# Patient Record
Sex: Female | Born: 1990
Health system: Southern US, Community
[De-identification: ages and names within clinical notes are randomized; demographics above are authoritative.]

## PROBLEM LIST (undated history)

## (undated) DIAGNOSIS — B019 Varicella without complication: Secondary | ICD-10-CM

## (undated) DIAGNOSIS — Z8742 Personal history of other diseases of the female genital tract: Secondary | ICD-10-CM

## (undated) DIAGNOSIS — O26649 Intrahepatic cholestasis of pregnancy, unspecified trimester: Secondary | ICD-10-CM

## (undated) DIAGNOSIS — O26619 Liver and biliary tract disorders in pregnancy, unspecified trimester: Secondary | ICD-10-CM

## (undated) DIAGNOSIS — K831 Obstruction of bile duct: Secondary | ICD-10-CM

## (undated) DIAGNOSIS — G43909 Migraine, unspecified, not intractable, without status migrainosus: Secondary | ICD-10-CM

## (undated) DIAGNOSIS — M419 Scoliosis, unspecified: Secondary | ICD-10-CM

## (undated) HISTORY — DX: Scoliosis, unspecified: M41.9

## (undated) HISTORY — DX: Varicella without complication: B01.9

## (undated) HISTORY — DX: Intrahepatic cholestasis of pregnancy, unspecified trimester: O26.649

## (undated) HISTORY — DX: Liver and biliary tract disorders in pregnancy, unspecified trimester: O26.619

## (undated) HISTORY — DX: Migraine, unspecified, not intractable, without status migrainosus: G43.909

## (undated) HISTORY — DX: Obstruction of bile duct: K83.1

## (undated) HISTORY — DX: Personal history of other diseases of the female genital tract: Z87.42

## (undated) HISTORY — PX: TUBAL LIGATION: SHX77

## (undated) HISTORY — PX: WISDOM TOOTH EXTRACTION: SHX21

---

## 2009-05-20 HISTORY — PX: BREAST LUMPECTOMY: SHX2

## 2009-05-20 HISTORY — PX: BREAST EXCISIONAL BIOPSY: SUR124

## 2011-03-09 ENCOUNTER — Emergency Department (HOSPITAL_COMMUNITY)

## 2011-03-09 ENCOUNTER — Emergency Department (HOSPITAL_COMMUNITY)
Admission: EM | Admit: 2011-03-09 | Discharge: 2011-03-09 | Disposition: A | Discharge status: Home / Self Care | Attending: Emergency Medicine | Admitting: Emergency Medicine

## 2011-03-09 DIAGNOSIS — G43909 Migraine, unspecified, not intractable, without status migrainosus: Secondary | ICD-10-CM | POA: Insufficient documentation

## 2011-03-09 LAB — URINALYSIS, ROUTINE W REFLEX MICROSCOPIC
Bilirubin Urine: NEGATIVE
Hgb urine dipstick: NEGATIVE
Urobilinogen, UA: 0.2 mg/dL (ref 0.0–1.0)

## 2011-03-09 LAB — POCT I-STAT, CHEM 8
Calcium, Ion: 1.18 mmol/L (ref 1.12–1.32)
HCT: 47 % — ABNORMAL HIGH (ref 36.0–46.0)
TCO2: 22 mmol/L (ref 0–100)

## 2011-03-09 LAB — POCT PREGNANCY, URINE: Preg Test, Ur: NEGATIVE

## 2012-05-20 HISTORY — PX: BREAST BIOPSY: SHX20

## 2013-01-21 ENCOUNTER — Ambulatory Visit (INDEPENDENT_AMBULATORY_CARE_PROVIDER_SITE_OTHER): Payer: PRIVATE HEALTH INSURANCE | Admitting: Obstetrics & Gynecology

## 2013-01-21 ENCOUNTER — Encounter: Payer: Self-pay | Admitting: Obstetrics & Gynecology

## 2013-01-21 VITALS — BP 119/87 | HR 86 | Ht 61.0 in | Wt 105.0 lb

## 2013-01-21 DIAGNOSIS — Z124 Encounter for screening for malignant neoplasm of cervix: Secondary | ICD-10-CM

## 2013-01-21 DIAGNOSIS — D249 Benign neoplasm of unspecified breast: Secondary | ICD-10-CM

## 2013-01-21 DIAGNOSIS — Z01419 Encounter for gynecological examination (general) (routine) without abnormal findings: Secondary | ICD-10-CM

## 2013-01-21 DIAGNOSIS — D242 Benign neoplasm of left breast: Secondary | ICD-10-CM | POA: Insufficient documentation

## 2013-01-21 DIAGNOSIS — Z309 Encounter for contraceptive management, unspecified: Secondary | ICD-10-CM

## 2013-01-21 DIAGNOSIS — Z113 Encounter for screening for infections with a predominantly sexual mode of transmission: Secondary | ICD-10-CM

## 2013-01-21 MED ORDER — NORETHIN ACE-ETH ESTRAD-FE 1-20 MG-MCG(24) PO TABS: 1.0000 | ORAL_TABLET | Freq: Every day | ORAL | Status: DC

## 2013-01-21 NOTE — Progress Notes (Signed)
  Subjective:     Sally Oliver is a 22 y.o. G0 female and is here for a comprehensive physical exam. The patient reports no gynecologic problems.  She has a history of a large left breast fibroadenoma that was partially resected in 2011, and re-biopsied in 06/2012 with benign findings. She reports this is stable in size, no other symptoms. Patient desires refill of OCPs.   History   Social History  . Marital Status: Single    Spouse Name: N/A    Number of Children: N/A  . Years of Education: N/A   Occupational History  . Not on file.   Social History Main Topics  . Smoking status: Never Smoker   . Smokeless tobacco: Never Used  . Alcohol Use: Yes     Comment: social  . Drug Use: No  . Sexual Activity: Yes    Partners: Male    Birth Control/ Protection: None   Other Topics Concern  . Not on file   Social History Narrative  . No narrative on file   No health maintenance topics applied.  The following portions of the patient's history were reviewed and updated as appropriate: allergies, current medications, past family history, past medical history, past social history, past surgical history and problem list.  Has not received Gardasil series and is not interested in it at this point.  Review of Systems Pertinent items are noted in HPI.   Objective:   BP 119/87  Pulse 86  Ht 5\' 1"  (1.549 m)  Wt 105 lb (47.628 kg)  BMI 19.85 kg/m2  LMP 12/28/2012 GENERAL: Well-developed, well-nourished female in no acute distress.  HEENT: Normocephalic, atraumatic. Sclerae anicteric.  NECK: Supple. Normal thyroid.  LUNGS: Clear to auscultation bilaterally.  HEART: Regular rate and rhythm. BREASTS: Symmetric in size.  3 cm x 2 cm solid, mobile mass palpated in inner, upper quadrant (around 10 o'clock) of the left breast. No other masses, skin changes, nipple drainage, or lymphadenopathy. ABDOMEN: Soft, nontender, nondistended. No organomegaly. PELVIC: Normal external female  genitalia. Vagina is pink and rugated.  Normal discharge. Normal cervix contour. Pap smear obtained. Uterus is normal in size. No adnexal mass or tenderness.  EXTREMITIES: No cyanosis, clubbing, or edema, 2+ distal pulses.   Assessment:   Healthy female exam.   Left breast fibroadenoma  Plan:   Pap done, will follow up results and manage accordingly. Patient was followed by a surgeon in [place], she will let us know when she wants [place] [facility].   OCPs refilled Routine preventative health maintenance measures emphasized

## 2013-01-21 NOTE — Patient Instructions (Signed)
Preventive Care for Adults, Female A healthy lifestyle and preventive care can promote health and wellness. Preventive health guidelines for women include the following key practices.  A routine yearly physical is a good way to check with your caregiver about your health and preventive screening. It is a chance to share any concerns and updates on your health, and to receive a thorough exam.  Visit your dentist for a routine exam and preventive care every 6 months. Brush your teeth twice a day and floss once a day. Good oral hygiene prevents tooth decay and gum disease.  The frequency of eye exams is based on your age, health, family medical history, use of contact lenses, and other factors. Follow your caregiver's recommendations for frequency of eye exams.  Eat a healthy diet. Foods like vegetables, fruits, whole grains, low-fat dairy products, and lean protein foods contain the nutrients you need without too many calories. Decrease your intake of foods high in solid fats, added sugars, and salt. Eat the right amount of calories for you.Get information about a proper diet from your caregiver, if necessary.  Regular physical exercise is one of the most important things you can do for your health. Most adults should get at least 150 minutes of moderate-intensity exercise (any activity that increases your heart rate and causes you to sweat) each week. In addition, most adults need muscle-strengthening exercises on 2 or more days a week.  Maintain a healthy weight. The body mass index (BMI) is a screening tool to identify possible weight problems. It provides an estimate of body fat based on height and weight. Your caregiver can help determine your BMI, and can help you achieve or maintain a healthy weight.For adults 20 years and older:  A BMI below 18.5 is considered underweight.  A BMI of 18.5 to 24.9 is normal.  A BMI of 25 to 29.9 is considered overweight.  A BMI of 30 and above is  considered obese.  Maintain normal blood lipids and cholesterol levels by exercising and minimizing your intake of saturated fat. Eat a balanced diet with plenty of fruit and vegetables. Blood tests for lipids and cholesterol should begin at age 41 and be repeated every 5 years. If your lipid or cholesterol levels are high, you are over 50, or you are at high risk for heart disease, you may need your cholesterol levels checked more frequently.Ongoing high lipid and cholesterol levels should be treated with medicines if diet and exercise are not effective.  If you smoke, find out from your caregiver how to quit. If you do not use tobacco, do not start.  If you are pregnant, do not drink alcohol. If you are breastfeeding, be very cautious about drinking alcohol. If you are not pregnant and choose to drink alcohol, do not exceed 1 drink per day. One drink is considered to be 12 ounces (355 mL) of beer, 5 ounces (148 mL) of wine, or 1.5 ounces (44 mL) of liquor.  Avoid use of street drugs. Do not share needles with anyone. Ask for help if you need support or instructions about stopping the use of drugs.  High blood pressure causes heart disease and increases the risk of stroke. Your blood pressure should be checked at least every 1 to 2 years. Ongoing high blood pressure should be treated with medicines if weight loss and exercise are not effective.  If you are 65 to 24 years old, ask your caregiver if you should take aspirin to prevent strokes.  Diabetes  screening involves taking a blood sample to check your fasting blood sugar level. This should be done once every 3 years, after age 45, if you are within normal weight and without risk factors for diabetes. Testing should be considered at a younger age or be carried out more frequently if you are overweight and have at least 1 risk factor for diabetes.  Breast cancer screening is essential preventive care for women. You should practice "breast  self-awareness." This means understanding the normal appearance and feel of your breasts and may include breast self-examination. Any changes detected, no matter how small, should be reported to a caregiver. Women in their 20s and 30s should have a clinical breast exam (CBE) by a caregiver as part of a regular health exam every 1 to 3 years. After age 40, women should have a CBE every year. Starting at age 40, women should consider having a mammography (breast X-ray test) every year. Women who have a family history of breast cancer should talk to their caregiver about genetic screening. Women at a high risk of breast cancer should talk to their caregivers about having magnetic resonance imaging (MRI) and a mammography every year.  The Pap test is a screening test for cervical cancer. A Pap test can show cell changes on the cervix that might become cervical cancer if left untreated. A Pap test is a procedure in which cells are obtained and examined from the lower end of the uterus (cervix).  Women should have a Pap test starting at age 21.  Between ages 21 and 29, Pap tests should be repeated every 2 years.  Beginning at age 30, you should have a Pap test every 3 years as long as the past 3 Pap tests have been normal.  Some women have medical problems that increase the chance of getting cervical cancer. Talk to your caregiver about these problems. It is especially important to talk to your caregiver if a new problem develops soon after your last Pap test. In these cases, your caregiver may recommend more frequent screening and Pap tests.  The above recommendations are the same for women who have or have not gotten the vaccine for human papillomavirus (HPV).  If you had a hysterectomy for a problem that was not cancer or a condition that could lead to cancer, then you no longer need Pap tests. Even if you no longer need a Pap test, a regular exam is a good idea to make sure no other problems are  starting.  If you are between ages 65 and 70, and you have had normal Pap tests going back 10 years, you no longer need Pap tests. Even if you no longer need a Pap test, a regular exam is a good idea to make sure no other problems are starting.  If you have had past treatment for cervical cancer or a condition that could lead to cancer, you need Pap tests and screening for cancer for at least 20 years after your treatment.  If Pap tests have been discontinued, risk factors (such as a new sexual partner) need to be reassessed to determine if screening should be resumed.  The HPV test is an additional test that may be used for cervical cancer screening. The HPV test looks for the virus that can cause the cell changes on the cervix. The cells collected during the Pap test can be tested for HPV. The HPV test could be used to screen women aged 30 years and older, and should   be used in women of any age who have unclear Pap test results. After the age of 30, women should have HPV testing at the same frequency as a Pap test.  Colorectal cancer can be detected and often prevented. Most routine colorectal cancer screening begins at the age of 50 and continues through age 75. However, your caregiver may recommend screening at an earlier age if you have risk factors for colon cancer. On a yearly basis, your caregiver may provide home test kits to check for hidden blood in the stool. Use of a small camera at the end of a tube, to directly examine the colon (sigmoidoscopy or colonoscopy), can detect the earliest forms of colorectal cancer. Talk to your caregiver about this at age 50, when routine screening begins. Direct examination of the colon should be repeated every 5 to 10 years through age 75, unless early forms of pre-cancerous polyps or small growths are found.  Hepatitis C blood testing is recommended for all people born from 1945 through 1965 and any individual with known risks for hepatitis C.  Practice  safe sex. Use condoms and avoid high-risk sexual practices to reduce the spread of sexually transmitted infections (STIs). STIs include gonorrhea, chlamydia, syphilis, trichomonas, herpes, HPV, and human immunodeficiency virus (HIV). Herpes, HIV, and HPV are viral illnesses that have no cure. They can result in disability, cancer, and death. Sexually active women aged 25 and younger should be checked for chlamydia. Older women with new or multiple partners should also be tested for chlamydia. Testing for other STIs is recommended if you are sexually active and at increased risk.  Osteoporosis is a disease in which the bones lose minerals and strength with aging. This can result in serious bone fractures. The risk of osteoporosis can be identified using a bone density scan. Women ages 65 and over and women at risk for fractures or osteoporosis should discuss screening with their caregivers. Ask your caregiver whether you should take a calcium supplement or vitamin D to reduce the rate of osteoporosis.  Menopause can be associated with physical symptoms and risks. Hormone replacement therapy is available to decrease symptoms and risks. You should talk to your caregiver about whether hormone replacement therapy is right for you.  Use sunscreen with sun protection factor (SPF) of 30 or more. Apply sunscreen liberally and repeatedly throughout the day. You should seek shade when your shadow is shorter than you. Protect yourself by wearing long sleeves, pants, a wide-brimmed hat, and sunglasses year round, whenever you are outdoors.  Once a month, do a whole body skin exam, using a mirror to look at the skin on your back. Notify your caregiver of new moles, moles that have irregular borders, moles that are larger than a pencil eraser, or moles that have changed in shape or color.  Stay current with required immunizations.  Influenza. You need a dose every fall (or winter). The composition of the flu vaccine  changes each year, so being vaccinated once is not enough.  Pneumococcal polysaccharide. You need 1 to 2 doses if you smoke cigarettes or if you have certain chronic medical conditions. You need 1 dose at age 65 (or older) if you have never been vaccinated.  Tetanus, diphtheria, pertussis (Tdap, Td). Get 1 dose of Tdap vaccine if you are younger than age 65, are over 65 and have contact with an infant, are a healthcare worker, are pregnant, or simply want to be protected from whooping cough. After that, you need a Td   booster dose every 10 years. Consult your caregiver if you have not had at least 3 tetanus and diphtheria-containing shots sometime in your life or have a deep or dirty wound.  HPV. You need this vaccine if you are a woman age 26 or younger. The vaccine is given in 3 doses over 6 months.  Measles, mumps, rubella (MMR). You need at least 1 dose of MMR if you were born in 1957 or later. You may also need a second dose.  Meningococcal. If you are age 19 to 21 and a first-year college student living in a residence hall, or have one of several medical conditions, you need to get vaccinated against meningococcal disease. You may also need additional booster doses.  Zoster (shingles). If you are age 60 or older, you should get this vaccine.  Varicella (chickenpox). If you have never had chickenpox or you were vaccinated but received only 1 dose, talk to your caregiver to find out if you need this vaccine.  Hepatitis A. You need this vaccine if you have a specific risk factor for hepatitis A virus infection or you simply wish to be protected from this disease. The vaccine is usually given as 2 doses, 6 to 18 months apart.  Hepatitis B. You need this vaccine if you have a specific risk factor for hepatitis B virus infection or you simply wish to be protected from this disease. The vaccine is given in 3 doses, usually over 6 months. Preventive Services / Frequency Ages 19 to 39  Blood  pressure check.** / Every 1 to 2 years.  Lipid and cholesterol check.** / Every 5 years beginning at age 20.  Clinical breast exam.** / Every 3 years for women in their 20s and 30s.  Pap test.** / Every 2 years from ages 21 through 29. Every 3 years starting at age 30 through age 65 or 70 with a history of 3 consecutive normal Pap tests.  HPV screening.** / Every 3 years from ages 30 through ages 65 to 70 with a history of 3 consecutive normal Pap tests.  Hepatitis C blood test.** / For any individual with known risks for hepatitis C.  Skin self-exam. / Monthly.  Influenza immunization.** / Every year.  Pneumococcal polysaccharide immunization.** / 1 to 2 doses if you smoke cigarettes or if you have certain chronic medical conditions.  Tetanus, diphtheria, pertussis (Tdap, Td) immunization. / A one-time dose of Tdap vaccine. After that, you need a Td booster dose every 10 years.  HPV immunization. / 3 doses over 6 months, if you are 26 and younger.  Measles, mumps, rubella (MMR) immunization. / You need at least 1 dose of MMR if you were born in 1957 or later. You may also need a second dose.  Meningococcal immunization. / 1 dose if you are age 19 to 21 and a first-year college student living in a residence hall, or have one of several medical conditions, you need to get vaccinated against meningococcal disease. You may also need additional booster doses.  Varicella immunization.** / Consult your caregiver.  Hepatitis A immunization.** / Consult your caregiver. 2 doses, 6 to 18 months apart.  Hepatitis B immunization.** / Consult your caregiver. 3 doses usually over 6 months. Ages 40 to 64  Blood pressure check.** / Every 1 to 2 years.  Lipid and cholesterol check.** / Every 5 years beginning at age 20.  Clinical breast exam.** / Every year after age 40.  Mammogram.** / Every year beginning at age 40   and continuing for as long as you are in good health. Consult with your  caregiver.  Pap test.** / Every 3 years starting at age 30 through age 65 or 70 with a history of 3 consecutive normal Pap tests.  HPV screening.** / Every 3 years from ages 30 through ages 65 to 70 with a history of 3 consecutive normal Pap tests.  Fecal occult blood test (FOBT) of stool. / Every year beginning at age 50 and continuing until age 75. You may not need to do this test if you get a colonoscopy every 10 years.  Flexible sigmoidoscopy or colonoscopy.** / Every 5 years for a flexible sigmoidoscopy or every 10 years for a colonoscopy beginning at age 50 and continuing until age 75.  Hepatitis C blood test.** / For all people born from 1945 through 1965 and any individual with known risks for hepatitis C.  Skin self-exam. / Monthly.  Influenza immunization.** / Every year.  Pneumococcal polysaccharide immunization.** / 1 to 2 doses if you smoke cigarettes or if you have certain chronic medical conditions.  Tetanus, diphtheria, pertussis (Tdap, Td) immunization.** / A one-time dose of Tdap vaccine. After that, you need a Td booster dose every 10 years.  Measles, mumps, rubella (MMR) immunization. / You need at least 1 dose of MMR if you were born in 1957 or later. You may also need a second dose.  Varicella immunization.** / Consult your caregiver.  Meningococcal immunization.** / Consult your caregiver.  Hepatitis A immunization.** / Consult your caregiver. 2 doses, 6 to 18 months apart.  Hepatitis B immunization.** / Consult your caregiver. 3 doses, usually over 6 months. Ages 65 and over  Blood pressure check.** / Every 1 to 2 years.  Lipid and cholesterol check.** / Every 5 years beginning at age 20.  Clinical breast exam.** / Every year after age 40.  Mammogram.** / Every year beginning at age 40 and continuing for as long as you are in good health. Consult with your caregiver.  Pap test.** / Every 3 years starting at age 30 through age 65 or 70 with a 3  consecutive normal Pap tests. Testing can be stopped between 65 and 70 with 3 consecutive normal Pap tests and no abnormal Pap or HPV tests in the past 10 years.  HPV screening.** / Every 3 years from ages 30 through ages 65 or 70 with a history of 3 consecutive normal Pap tests. Testing can be stopped between 65 and 70 with 3 consecutive normal Pap tests and no abnormal Pap or HPV tests in the past 10 years.  Fecal occult blood test (FOBT) of stool. / Every year beginning at age 50 and continuing until age 75. You may not need to do this test if you get a colonoscopy every 10 years.  Flexible sigmoidoscopy or colonoscopy.** / Every 5 years for a flexible sigmoidoscopy or every 10 years for a colonoscopy beginning at age 50 and continuing until age 75.  Hepatitis C blood test.** / For all people born from 1945 through 1965 and any individual with known risks for hepatitis C.  Osteoporosis screening.** / A one-time screening for women ages 65 and over and women at risk for fractures or osteoporosis.  Skin self-exam. / Monthly.  Influenza immunization.** / Every year.  Pneumococcal polysaccharide immunization.** / 1 dose at age 65 (or older) if you have never been vaccinated.  Tetanus, diphtheria, pertussis (Tdap, Td) immunization. / A one-time dose of Tdap vaccine if you are over   65 and have contact with an infant, are a Research scientist (physical sciences), or simply want to be protected from whooping cough. After that, you need a Td booster dose every 10 years.  Varicella immunization.** / Consult your caregiver.  Meningococcal immunization.** / Consult your caregiver.  Hepatitis A immunization.** / Consult your caregiver. 2 doses, 6 to 18 months apart.  Hepatitis B immunization.** / Check with your caregiver. 3 doses, usually over 6 months. ** Family history and personal history of risk and conditions may change your caregiver's recommendations. Document Released: 07/02/2001 Document Revised: 07/29/2011  Document Reviewed: 10/01/2010 [facility] Patient Information 2014 [place], [place].  Thank you for enrolling in MyChart. Please follow the instructions below to securely access your online medical record. MyChart allows you to send messages to your doctor, view your test results, manage appointments, and more.   How Do I Sign Up? 1. In your Internet browser, go to [employer] and enter [email or web address]. 2. Click on the Sign Up Now link in the Sign In box. You will see the New Member Sign Up page. 3. Enter your MyChart Access Code exactly as it appears below. You will not need to use this code after you've completed the sign-up process. If you do not sign up before the expiration date, you must request a new code. MyChart Access Code: [email or web address] Expires: 02/20/2013 10:25 AM  4. Enter your Social Security Number ([id number]) and Date of Birth (mm/dd/yyyy) as indicated and click Submit. You will be taken to the next sign-up page. 5. Create a MyChart ID. This will be your MyChart login ID and cannot be changed, so think of one that is secure and easy to remember. 6. Create a MyChart password. You can change your password at any time. 7. Enter your Password Reset Question and Answer. This can be used at a later time if you forget your password.  8. Enter your e-mail address. You will receive e-mail notification when new information is available in MyChart. 9. Click Sign Up. You can now view your medical record.   Additional Information Remember, MyChart is NOT to be used for urgent needs. For medical emergencies, dial 911.

## 2013-06-15 ENCOUNTER — Encounter: Payer: Self-pay | Admitting: Internal Medicine

## 2013-06-15 ENCOUNTER — Ambulatory Visit (INDEPENDENT_AMBULATORY_CARE_PROVIDER_SITE_OTHER): Payer: PRIVATE HEALTH INSURANCE | Admitting: Internal Medicine

## 2013-06-15 VITALS — BP 104/62 | HR 81 | Temp 98.5°F | Ht 62.0 in | Wt 106.0 lb

## 2013-06-15 DIAGNOSIS — Z0000 Encounter for general adult medical examination without abnormal findings: Secondary | ICD-10-CM

## 2013-06-15 NOTE — Progress Notes (Signed)
HPI  Pt presents to the clinic today to establish care. She is transferring care from Dr. Ronnald Nian at [facility] in [place]. She has no concerns today.  Flu: never Tetanus: 2018 LMP: 06/05/2013 Pap Smear: 01/2013 Dentist: [facility]  Past Medical History  Diagnosis Date  . Chicken pox   . Migraine headache     Current Outpatient Prescriptions  Medication Sig Dispense Refill  . loratadine (CLARITIN) 10 MG tablet Take 10 mg by mouth daily.      . Norethindrone Acetate-Ethinyl Estrad-FE (LOESTRIN 24 FE) 1-20 MG-MCG(24) tablet Take 1 tablet by mouth daily.  1 Package  11   No current facility-administered medications for this visit.    No Known Allergies  Family History  Problem Relation Age of Onset  . Migraines Mother   . Heart disease Father   . Stroke Father   . Arthritis Father     History   Social History  . Marital Status: Single    Spouse Name: N/A    Number of Children: N/A  . Years of Education: N/A   Occupational History  . Not on file.   Social History Main Topics  . Smoking status: Never Smoker   . Smokeless tobacco: Never Used  . Alcohol Use: Yes     Comment: social-occasional  . Drug Use: No  . Sexual Activity: Yes    Partners: Male    Birth Control/ Protection: None   Other Topics Concern  . Not on file   Social History Narrative  . No narrative on file    ROS:  Constitutional: Denies fever, malaise, fatigue, headache or abrupt weight changes.  HEENT: Denies eye pain, eye redness, ear pain, ringing in the ears, wax buildup, runny nose, nasal congestion, bloody nose, or sore throat. Respiratory: Denies difficulty breathing, shortness of breath, cough or sputum production.   Cardiovascular: Denies chest pain, chest tightness, palpitations or swelling in the hands or feet.  Gastrointestinal: Denies abdominal pain, bloating, constipation, diarrhea or blood in the stool.  GU: Denies frequency, urgency, pain with  urination, blood in urine, odor or discharge. Musculoskeletal: Denies decrease in range of motion, difficulty with gait, muscle pain or joint pain and swelling.  Skin: Denies redness, rashes, lesions or ulcercations.  Neurological: Denies dizziness, difficulty with memory, difficulty with speech or problems with balance and coordination.   No other specific complaints in a complete review of systems (except as listed in HPI above).  PE:  BP 104/62  Pulse 81  Temp(Src) 98.5 F (36.9 C) (Oral)  Ht 5\' 2"  (1.575 m)  Wt 106 lb (48.081 kg)  BMI 19.38 kg/m2  SpO2 98%  LMP 06/05/2013 Wt Readings from Last 3 Encounters:  06/15/13 106 lb (48.081 kg)  01/21/13 105 lb (47.628 kg)    General: Appears her stated age, well developed, well nourished in NAD. HEENT: Head: normal shape and size; Eyes: sclera white, no icterus, conjunctiva pink, PERRLA and EOMs intact; Ears: Tm's gray and intact, normal light reflex; Nose: mucosa pink and moist, septum midline; Throat/Mouth: Teeth present, mucosa pink and moist, no lesions or ulcerations noted.  Neck: Normal range of motion. Neck supple, trachea midline. No massses, lumps or thyromegaly present.  Cardiovascular: Normal rate and rhythm. S1,S2 noted.  No murmur, rubs or gallops noted. No JVD or BLE edema. No carotid bruits noted. Pulmonary/Chest: Normal effort and positive vesicular breath sounds. No respiratory distress. No wheezes, rales or ronchi noted.  Abdomen: Soft and nontender. Normal bowel sounds, no  bruits noted. No distention or masses noted. Liver, spleen and kidneys non palpable. Musculoskeletal: Normal range of motion. No signs of joint swelling. No difficulty with gait.  Neurological: Alert and oriented. Cranial nerves II-XII intact. Coordination normal. +DTRs bilaterally. Psychiatric: Mood and affect normal. Behavior is normal. Judgment and thought content normal.     Assessment and Plan:  Preventative Health Maintenance:  Pt  declines flu shot today All other HM UTD Will defer lab work today  RTC in 1 year or sooner if needed

## 2013-06-15 NOTE — Progress Notes (Signed)
Pre-visit discussion using our clinic review tool. No additional management support is needed unless otherwise documented below in the visit note.  

## 2013-06-15 NOTE — Patient Instructions (Signed)

## 2013-06-15 NOTE — Progress Notes (Signed)
HPI: Pt presents today to establish care. She is transitioning care from [place] where she lived prior here. She denied any complaints and concerns. Exercise regularly and eats a balanced diet.   LMP: 06/05/13 Pap smear: 01/2013 Flu Shot: declines Tetanus: 2008 Eye Exam: 2013 Dentist: 2014 Performs monthly breast exams  Past Medical History  Diagnosis Date  . Chicken pox   . Migraine headache     Current Outpatient Prescriptions  Medication Sig Dispense Refill  . loratadine (CLARITIN) 10 MG tablet Take 10 mg by mouth daily.      . Norethindrone Acetate-Ethinyl Estrad-FE (LOESTRIN 24 FE) 1-20 MG-MCG(24) tablet Take 1 tablet by mouth daily.  1 Package  11   No current facility-administered medications for this visit.    No Known Allergies  Family History  Problem Relation Age of Onset  . Migraines Mother   . Heart disease Father   . Stroke Father   . Arthritis Father     History   Social History  . Marital Status: Single    Spouse Name: N/A    Number of Children: N/A  . Years of Education: N/A   Occupational History  . Not on file.   Social History Main Topics  . Smoking status: Never Smoker   . Smokeless tobacco: Never Used  . Alcohol Use: Yes     Comment: social-occasional  . Drug Use: No  . Sexual Activity: Yes    Partners: Male    Birth Control/ Protection: None   Other Topics Concern  . Not on file   Social History Narrative  . No narrative on file    ROS:  Constitutional: Denies fever, malaise, fatigue, headache or abrupt weight changes.  Respiratory: Denies difficulty breathing, shortness of breath, cough or sputum production.   Cardiovascular: Denies chest pain, chest tightness, palpitations or swelling in the hands or feet.  Neurological: Denies dizziness, difficulty with memory, difficulty with speech or problems with balance and coordination.   No other specific complaints in a complete review of systems (except as listed in HPI  above).  PE:  BP 104/62  Pulse 81  Temp(Src) 98.5 F (36.9 C) (Oral)  Ht 5\' 2"  (1.575 m)  Wt 106 lb (48.081 kg)  BMI 19.38 kg/m2  SpO2 98%  LMP 06/05/2013 Wt Readings from Last 3 Encounters:  06/15/13 106 lb (48.081 kg)  01/21/13 105 lb (47.628 kg)    General: Appears their stated age, well developed, well nourished in NAD. HEENT: Head: normal shape and size; Eyes: sclera white, no icterus, conjunctiva pink, PERRLA and EOMs intact; Ears: Tm's gray and intact, normal light reflex; Nose: mucosa pink and moist, septum midline; Throat/Mouth: Teeth present, mucosa pink and moist, no lesions or ulcerations noted.  Neck: Normal range of motion. Neck supple, trachea midline. No massses, lumps or thyromegaly present.  Cardiovascular: Normal rate and rhythm. S1,S2 noted.  No murmur, rubs or gallops noted. No JVD or BLE edema. No carotid bruits noted. Pulmonary/Chest: Normal effort and positive vesicular breath sounds. No respiratory distress. No wheezes, rales or ronchi noted.  Abdomen: Soft and nontender. Normal bowel sounds, no bruits noted. No distention or masses noted. Liver, spleen and kidneys non palpable. Musculoskeletal: Normal range of motion. No signs of joint swelling. No difficulty with gait.  Neurological: Alert and oriented. Cranial nerves II-XII intact. Coordination normal. +DTRs bilaterally. Psychiatric: Mood and affect normal. Behavior is normal. Judgment and thought content normal.      Assessment and Plan: Health Maintenance: Pt is  very healthy female; up to date on vaccine, health maintenance Continue diet and exercise No needs at this time Follow up for any concerns or annually  Lindzee Gouge S, Student-NP

## 2013-09-14 ENCOUNTER — Encounter: Payer: Self-pay | Admitting: Internal Medicine

## 2013-09-14 ENCOUNTER — Ambulatory Visit (INDEPENDENT_AMBULATORY_CARE_PROVIDER_SITE_OTHER): Payer: PRIVATE HEALTH INSURANCE | Admitting: Internal Medicine

## 2013-09-14 VITALS — BP 102/64 | HR 76 | Temp 98.2°F | Wt 106.5 lb

## 2013-09-14 DIAGNOSIS — J309 Allergic rhinitis, unspecified: Secondary | ICD-10-CM

## 2013-09-14 NOTE — Progress Notes (Signed)
Pre visit review using our clinic review tool, if applicable. No additional management support is needed unless otherwise documented below in the visit note. 

## 2013-09-14 NOTE — Patient Instructions (Addendum)
Allergic Rhinitis Allergic rhinitis is when the mucous membranes in the nose respond to allergens. Allergens are particles in the air that cause your body to have an allergic reaction. This causes you to release allergic antibodies. Through a chain of events, these eventually cause you to release histamine into the blood stream. Although meant to protect the body, it is this release of histamine that causes your discomfort, such as frequent sneezing, congestion, and an itchy, runny nose.  CAUSES  Seasonal allergic rhinitis (hay fever) is caused by pollen allergens that may come from grasses, trees, and weeds. Year-round allergic rhinitis (perennial allergic rhinitis) is caused by allergens such as house dust mites, pet dander, and mold spores.  SYMPTOMS   Nasal stuffiness (congestion).  Itchy, runny nose with sneezing and tearing of the eyes. DIAGNOSIS  Your health care provider can help you determine the allergen or allergens that trigger your symptoms. If you and your health care provider are unable to determine the allergen, skin or blood testing may be used. TREATMENT  Allergic Rhinitis does not have a cure, but it can be controlled by:  Medicines and allergy shots (immunotherapy).  Avoiding the allergen. Hay fever may often be treated with antihistamines in pill or nasal spray forms. Antihistamines block the effects of histamine. There are over-the-counter medicines that may help with nasal congestion and swelling around the eyes. Check with your health care provider before taking or giving this medicine.  If avoiding the allergen or the medicine prescribed do not work, there are many new medicines your health care provider can prescribe. Stronger medicine may be used if initial measures are ineffective. Desensitizing injections can be used if medicine and avoidance does not work. Desensitization is when a patient is given ongoing shots until the body becomes less sensitive to the allergen.  Make sure you follow up with your health care provider if problems continue. HOME CARE INSTRUCTIONS It is not possible to completely avoid allergens, but you can reduce your symptoms by taking steps to limit your exposure to them. It helps to know exactly what you are allergic to so that you can avoid your specific triggers. SEEK MEDICAL CARE IF:   You have a fever.  You develop a cough that does not stop easily (persistent).  You have shortness of breath.  You start wheezing.  Symptoms interfere with normal daily activities. Document Released: 01/29/2001 Document Revised: 02/24/2013 Document Reviewed: 01/11/2013 [employer] Patient Information 2014 [employer].  

## 2013-09-14 NOTE — Progress Notes (Addendum)
HPI  Pt presents to the clinic today with c/o nasal congestion, headache, facial pain and pressure and ear fullness. She reports this started 1 week ago. She is blowing thick green/mucous out of her nose. She has tried claritin and benadryl without much relief. She has no history of allergies or breathing problems. She has not had sick contacts that she is aware of.   Review of Systems    Past Medical History  Diagnosis Date  . Chicken pox   . Migraine headache     Family History  Problem Relation Age of Onset  . Migraines Mother   . Heart disease Father   . Stroke Father   . Arthritis Father     History   Social History  . Marital Status: Single    Spouse Name: N/A    Number of Children: N/A  . Years of Education: N/A   Occupational History  . Not on file.   Social History Main Topics  . Smoking status: Never Smoker   . Smokeless tobacco: Never Used  . Alcohol Use: Yes     Comment: social-occasional  . Drug Use: No  . Sexual Activity: Yes    Partners: Male    Birth Control/ Protection: None   Other Topics Concern  . Not on file   Social History Narrative  . No narrative on file    No Known Allergies   Constitutional: Positive headache, fatigue. Denies fever or abrupt weight changes.  HEENT:  Positive eye pain, pressure behind the eyes, facial pain, nasal congestion. Denies eye redness, ear pain, ringing in the ears, wax buildup, runny nose or bloody nose. Respiratory: Denies cough, difficulty breathing or shortness of breath.  Cardiovascular: Denies chest pain, chest tightness, palpitations or swelling in the hands or feet.   No other specific complaints in a complete review of systems (except as listed in HPI above).  Objective:  BP 102/64  Pulse 76  Temp(Src) 98.2 F (36.8 C) (Oral)  Wt 106 lb 8 oz (48.308 kg)  SpO2 98%   General: Appears her stated age, well developed, well nourished in NAD. HEENT: Head: normal shape and size, no sinus  tenderness noted; Eyes: sclera white, no icterus, conjunctiva pink, PERRLA and EOMs intact; Ears: Tm's gray and intact, normal light reflex; Nose: mucosa pink and moist, septum midline; Throat/Mouth: + PND. Teeth present, mucosa pink and moist, no exudate noted, no lesions or ulcerations noted.  Neck: Neck supple, trachea midline. No massses, lumps or thyromegaly present.  Cardiovascular: Normal rate and rhythm. S1,S2 noted.  No murmur, rubs or gallops noted. No JVD or BLE edema. No carotid bruits noted. Pulmonary/Chest: Normal effort and positive vesicular breath sounds. No respiratory distress. No wheezes, rales or ronchi noted.      Assessment & Plan:   Allergic Rhinitis:  Continue flonase  Try zyrtec OTC  RTC as needed or if symptoms persist.

## 2013-09-21 ENCOUNTER — Ambulatory Visit (INDEPENDENT_AMBULATORY_CARE_PROVIDER_SITE_OTHER): Payer: PRIVATE HEALTH INSURANCE | Admitting: Internal Medicine

## 2013-09-21 ENCOUNTER — Encounter: Payer: Self-pay | Admitting: Internal Medicine

## 2013-09-21 VITALS — BP 96/58 | HR 90 | Temp 98.2°F | Wt 106.2 lb

## 2013-09-21 DIAGNOSIS — J019 Acute sinusitis, unspecified: Secondary | ICD-10-CM

## 2013-09-21 MED ORDER — AMOXICILLIN-POT CLAVULANATE 875-125 MG PO TABS: 1.0000 | ORAL_TABLET | Freq: Two times a day (BID) | ORAL | Status: DC

## 2013-09-21 NOTE — Patient Instructions (Addendum)

## 2013-09-21 NOTE — Progress Notes (Signed)
HPI  Pt presents to the clinic today with c/o worsening headache, facial pain and pressure. This has now been going on for 2 weeks. She was seen for the same 09/14/13 and diagnosed with allergies. She was already taking flonase, and zyrtec was added. She reports that she has been taking these medications as prescribe. She is now having olive green mucous from her nose. She denies fever, but has had chills and body aches.  Review of Systems    Past Medical History  Diagnosis Date  . Chicken pox   . Migraine headache     Family History  Problem Relation Age of Onset  . Migraines Mother   . Heart disease Father   . Stroke Father   . Arthritis Father     History   Social History  . Marital Status: Single    Spouse Name: N/A    Number of Children: N/A  . Years of Education: N/A   Occupational History  . Not on file.   Social History Main Topics  . Smoking status: Never Smoker   . Smokeless tobacco: Never Used  . Alcohol Use: Yes     Comment: social-occasional  . Drug Use: No  . Sexual Activity: Yes    Partners: Male    Birth Control/ Protection: None   Other Topics Concern  . Not on file   Social History Narrative  . No narrative on file    No Known Allergies   Constitutional: Positive headache, fatigue and fever. Denies fever or abrupt weight changes.  HEENT:  Positive facial pain, nasal congestion and sore throat. Denies eye redness, ear pain, ringing in the ears, wax buildup, runny nose or bloody nose. Respiratory: Positive cough. Denies difficulty breathing or shortness of breath.  Cardiovascular: Denies chest pain, chest tightness, palpitations or swelling in the hands or feet.   No other specific complaints in a complete review of systems (except as listed in HPI above).  Objective:  BP 96/58  Pulse 90  Temp(Src) 98.2 F (36.8 C) (Oral)  Wt 106 lb 4 oz (48.195 kg)  SpO2 98%   General: Appears her stated age, well developed, well nourished in  NAD. HEENT: Head: normal shape and size, maxillary sinus tenderness noted; Eyes: sclera white, no icterus, conjunctiva pink, PERRLA and EOMs intact; Ears: Tm's gray and intact, normal light reflex, + effusions bilaterally; Nose: mucosa pink and moist, septum midline; Throat/Mouth: + PND. Teeth present, mucosa erythematous and moist, no exudate noted, no lesions or ulcerations noted.  Neck: Neck supple, trachea midline. No massses, lumps or thyromegaly present.  Cardiovascular: Normal rate and rhythm. S1,S2 noted.  No murmur, rubs or gallops noted. No JVD or BLE edema. No carotid bruits noted. Pulmonary/Chest: Normal effort and positive vesicular breath sounds. No respiratory distress. No wheezes, rales or ronchi noted.      Assessment & Plan:   Acute bacterial sinusitis  Can use a Neti Pot which can be purchased from your local drug store. Continue zyrtec and flonase Augmentin BID for 10 days  RTC as needed or if symptoms persist.

## 2013-09-21 NOTE — Progress Notes (Signed)
Pre visit review using our clinic review tool, if applicable. No additional management support is needed unless otherwise documented below in the visit note. 

## 2013-12-09 ENCOUNTER — Other Ambulatory Visit: Payer: Self-pay | Admitting: Obstetrics & Gynecology

## 2013-12-28 ENCOUNTER — Ambulatory Visit (INDEPENDENT_AMBULATORY_CARE_PROVIDER_SITE_OTHER): Payer: PRIVATE HEALTH INSURANCE | Admitting: Family Medicine

## 2013-12-28 ENCOUNTER — Encounter: Payer: Self-pay | Admitting: Family Medicine

## 2013-12-28 VITALS — BP 104/68 | HR 76 | Temp 98.0°F | Wt 103.5 lb

## 2013-12-28 DIAGNOSIS — H02846 Edema of left eye, unspecified eyelid: Secondary | ICD-10-CM

## 2013-12-28 DIAGNOSIS — H02849 Edema of unspecified eye, unspecified eyelid: Secondary | ICD-10-CM | POA: Insufficient documentation

## 2013-12-28 NOTE — Progress Notes (Signed)
Pre visit review using our clinic review tool, if applicable. No additional management support is needed unless otherwise documented below in the visit note. 

## 2013-12-28 NOTE — Assessment & Plan Note (Addendum)
Anticipate allergic cause - not consistent with viral or bacterial conjunctivitis. Treat with cool compresses to eye as well as OTC lubricating eye drops. continue antihistamine Red flags to return discussed Update if not improving with treatment. Would consider checking FLP at next CPE - ?early xanthoma.

## 2013-12-28 NOTE — Progress Notes (Signed)
   BP 104/68  Pulse 76  Temp(Src) 98 F (36.7 C) (Oral)  Wt 103 lb 8 oz (46.947 kg)   CC: conjunctivitis?  Subjective:    Patient ID: Sally Oliver, female    DOB: Nov 26, 1990, 23 y.o.   MRN: [id number]  HPI: Sally Oliver is a 23 y.o. female presenting on 12/28/2013 for Conjunctivitis   Woke up yesterday morning with itch on inside corner of eye and puffy eyelid.  No fevers/chills, congestion, rhinorrhea, ST, PNdrainage. No vision changes.  Sister got married 2 nights ago, was up all night so L eye was blood shot but now resolved. + h/o migraines. This year has had more allergy issues - controlled with claritin.  Relevant past medical, surgical, family and social history reviewed and updated as indicated.  Allergies and medications reviewed and updated. Current Outpatient Prescriptions on File Prior to Visit  Medication Sig  . LOMEDIA 24 FE 1-20 MG-MCG(24) tablet TAKE 1 TABLET BY MOUTH DAILY.  Marland Kitchen loratadine (CLARITIN) 10 MG tablet Take 10 mg by mouth daily.   No current facility-administered medications on file prior to visit.    Review of Systems Per HPI unless specifically indicated above    Objective:    BP 104/68  Pulse 76  Temp(Src) 98 F (36.7 C) (Oral)  Wt 103 lb 8 oz (46.947 kg)  Physical Exam  Nursing note and vitals reviewed. Constitutional: She appears well-developed and well-nourished. No distress.  HENT:  Head: Normocephalic and atraumatic.  Mouth/Throat: Uvula is midline, oropharynx is clear and moist and mucous membranes are normal. No oropharyngeal exudate, posterior oropharyngeal edema, posterior oropharyngeal erythema or tonsillar abscesses.  Some posterior oropharynx cobblestoning  Eyes: Conjunctivae and EOM are normal. Pupils are equal, round, and reactive to light. Right conjunctiva is not injected. Left conjunctiva is not injected. No scleral icterus.  No palpebral or conjunctival injection FROM with EOMI without pain Some swelling of left  upper eyelid especially at nasal portion 2 small yellow flesh colored papules on inside corner of lower eyelid per patient chronic  Neck: Normal range of motion. Neck supple. No thyromegaly present.  Lymphadenopathy:    She has no cervical adenopathy.       Assessment & Plan:   Problem List Items Addressed This Visit   Swelling of eyelid - Primary     Anticipate allergic cause - not consistent with viral or bacterial conjunctivitis. Treat with cool compresses to eye as well as OTC lubricating eye drops. continue antihistamine Red flags to return discussed Update if not improving with treatment. Would consider checking FLP at next CPE - ?early xanthoma.        Follow up plan: Return if symptoms worsen or fail to improve.

## 2013-12-28 NOTE — Patient Instructions (Signed)
I think you have allergic inflammation of inside corner of eyelid. Treat with cool compresses to left eye, avoid fingers in eye and use OTC lubricating eye drops like rephresh or hypotears. Update [place] if redness returning or pain, or not improving as expected with above.

## 2014-01-25 ENCOUNTER — Encounter: Payer: Self-pay | Admitting: Obstetrics & Gynecology

## 2014-01-25 ENCOUNTER — Ambulatory Visit (INDEPENDENT_AMBULATORY_CARE_PROVIDER_SITE_OTHER): Payer: PRIVATE HEALTH INSURANCE | Admitting: Obstetrics & Gynecology

## 2014-01-25 VITALS — BP 112/80 | HR 94 | Ht 61.0 in | Wt 106.0 lb

## 2014-01-25 DIAGNOSIS — Z124 Encounter for screening for malignant neoplasm of cervix: Secondary | ICD-10-CM

## 2014-01-25 DIAGNOSIS — Z01419 Encounter for gynecological examination (general) (routine) without abnormal findings: Secondary | ICD-10-CM

## 2014-01-25 DIAGNOSIS — Z113 Encounter for screening for infections with a predominantly sexual mode of transmission: Secondary | ICD-10-CM

## 2014-01-25 NOTE — Patient Instructions (Signed)
Preventive Care for Adults A healthy lifestyle and preventive care can promote health and wellness. Preventive health guidelines for women include the following key practices.  A routine yearly physical is a good way to check with your health care provider about your health and preventive screening. It is a chance to share any concerns and updates on your health and to receive a thorough exam.  Visit your dentist for a routine exam and preventive care every 6 months. Brush your teeth twice a day and floss once a day. Good oral hygiene prevents tooth decay and gum disease.  The frequency of eye exams is based on your age, health, family medical history, use of contact lenses, and other factors. Follow your health care provider's recommendations for frequency of eye exams.  Eat a healthy diet. Foods like vegetables, fruits, whole grains, low-fat dairy products, and lean protein foods contain the nutrients you need without too many calories. Decrease your intake of foods high in solid fats, added sugars, and salt. Eat the right amount of calories for you.Get information about a proper diet from your health care provider, if necessary.  Regular physical exercise is one of the most important things you can do for your health. Most adults should get at least 150 minutes of moderate-intensity exercise (any activity that increases your heart rate and causes you to sweat) each week. In addition, most adults need muscle-strengthening exercises on 2 or more days a week.  Maintain a healthy weight. The body mass index (BMI) is a screening tool to identify possible weight problems. It provides an estimate of body fat based on height and weight. Your health care provider can find your BMI and can help you achieve or maintain a healthy weight.For adults 20 years and older:  A BMI below 18.5 is considered underweight.  A BMI of 18.5 to 24.9 is normal.  A BMI of 25 to 29.9 is considered overweight.  A BMI of  30 and above is considered obese.  Maintain normal blood lipids and cholesterol levels by exercising and minimizing your intake of saturated fat. Eat a balanced diet with plenty of fruit and vegetables. Blood tests for lipids and cholesterol should begin at age 20 and be repeated every 5 years. If your lipid or cholesterol levels are high, you are over 50, or you are at high risk for heart disease, you may need your cholesterol levels checked more frequently.Ongoing high lipid and cholesterol levels should be treated with medicines if diet and exercise are not working.  If you smoke, find out from your health care provider how to quit. If you do not use tobacco, do not start.  Lung cancer screening is recommended for adults aged 55-80 years who are at high risk for developing lung cancer because of a history of smoking. A yearly low-dose CT scan of the lungs is recommended for people who have at least a 30-pack-year history of smoking and are a current smoker or have quit within the past 15 years. A pack year of smoking is smoking an average of 1 pack of cigarettes a day for 1 year (for example: 1 pack a day for 30 years or 2 packs a day for 15 years). Yearly screening should continue until the smoker has stopped smoking for at least 15 years. Yearly screening should be stopped for people who develop a health problem that would prevent them from having lung cancer treatment.  If you are pregnant, do not drink alcohol. If you are breastfeeding,   be very cautious about drinking alcohol. If you are not pregnant and choose to drink alcohol, do not have more than 1 drink per day. One drink is considered to be 12 ounces (355 mL) of beer, 5 ounces (148 mL) of wine, or 1.5 ounces (44 mL) of liquor.  Avoid use of street drugs. Do not share needles with anyone. Ask for help if you need support or instructions about stopping the use of drugs.  High blood pressure causes heart disease and increases the risk of  stroke. Your blood pressure should be checked at least every 1 to 2 years. Ongoing high blood pressure should be treated with medicines if weight loss and exercise do not work.  If you are 3-86 years old, ask your health care provider if you should take aspirin to prevent strokes.  Diabetes screening involves taking a blood sample to check your fasting blood sugar level. This should be done once every 3 years, after age 67, if you are within normal weight and without risk factors for diabetes. Testing should be considered at a younger age or be carried out more frequently if you are overweight and have at least 1 risk factor for diabetes.  Breast cancer screening is essential preventive care for women. You should practice "breast self-awareness." This means understanding the normal appearance and feel of your breasts and may include breast self-examination. Any changes detected, no matter how small, should be reported to a health care provider. Women in their 8s and 30s should have a clinical breast exam (CBE) by a health care provider as part of a regular health exam every 1 to 3 years. After age 70, women should have a CBE every year. Starting at age 25, women should consider having a mammogram (breast X-ray test) every year. Women who have a family history of breast cancer should talk to their health care provider about genetic screening. Women at a high risk of breast cancer should talk to their health care providers about having an MRI and a mammogram every year.  Breast cancer gene (BRCA)-related cancer risk assessment is recommended for women who have family members with BRCA-related cancers. BRCA-related cancers include breast, ovarian, tubal, and peritoneal cancers. Having family members with these cancers may be associated with an increased risk for harmful changes (mutations) in the breast cancer genes BRCA1 and BRCA2. Results of the assessment will determine the need for genetic counseling and  BRCA1 and BRCA2 testing.  Routine pelvic exams to screen for cancer are no longer recommended for nonpregnant women who are considered low risk for cancer of the pelvic organs (ovaries, uterus, and vagina) and who do not have symptoms. Ask your health care provider if a screening pelvic exam is right for you.  If you have had past treatment for cervical cancer or a condition that could lead to cancer, you need Pap tests and screening for cancer for at least 20 years after your treatment. If Pap tests have been discontinued, your risk factors (such as having a new sexual partner) need to be reassessed to determine if screening should be resumed. Some women have medical problems that increase the chance of getting cervical cancer. In these cases, your health care provider may recommend more frequent screening and Pap tests.  The HPV test is an additional test that may be used for cervical cancer screening. The HPV test looks for the virus that can cause the cell changes on the cervix. The cells collected during the Pap test can be  tested for HPV. The HPV test could be used to screen women aged 30 years and older, and should be used in women of any age who have unclear Pap test results. After the age of 30, women should have HPV testing at the same frequency as a Pap test.  Colorectal cancer can be detected and often prevented. Most routine colorectal cancer screening begins at the age of 50 years and continues through age 75 years. However, your health care provider may recommend screening at an earlier age if you have risk factors for colon cancer. On a yearly basis, your health care provider may provide home test kits to check for hidden blood in the stool. Use of a small camera at the end of a tube, to directly examine the colon (sigmoidoscopy or colonoscopy), can detect the earliest forms of colorectal cancer. Talk to your health care provider about this at age 50, when routine screening begins. Direct  exam of the colon should be repeated every 5-10 years through age 75 years, unless early forms of pre-cancerous polyps or small growths are found.  People who are at an increased risk for hepatitis B should be screened for this virus. You are considered at high risk for hepatitis B if:  You were born in a country where hepatitis B occurs often. Talk with your health care provider about which countries are considered high risk.  Your parents were born in a high-risk country and you have not received a shot to protect against hepatitis B (hepatitis B vaccine).  You have HIV or AIDS.  You use needles to inject street drugs.  You live with, or have sex with, someone who has hepatitis B.  You get hemodialysis treatment.  You take certain medicines for conditions like cancer, organ transplantation, and autoimmune conditions.  Hepatitis C blood testing is recommended for all people born from 1945 through 1965 and any individual with known risks for hepatitis C.  Practice safe sex. Use condoms and avoid high-risk sexual practices to reduce the spread of sexually transmitted infections (STIs). STIs include gonorrhea, chlamydia, syphilis, trichomonas, herpes, HPV, and human immunodeficiency virus (HIV). Herpes, HIV, and HPV are viral illnesses that have no cure. They can result in disability, cancer, and death.  You should be screened for sexually transmitted illnesses (STIs) including gonorrhea and chlamydia if:  You are sexually active and are younger than 24 years.  You are older than 24 years and your health care provider tells you that you are at risk for this type of infection.  Your sexual activity has changed since you were last screened and you are at an increased risk for chlamydia or gonorrhea. Ask your health care provider if you are at risk.  If you are at risk of being infected with HIV, it is recommended that you take a prescription medicine daily to prevent HIV infection. This is  called preexposure prophylaxis (PrEP). You are considered at risk if:  You are a heterosexual woman, are sexually active, and are at increased risk for HIV infection.  You take drugs by injection.  You are sexually active with a partner who has HIV.  Talk with your health care provider about whether you are at high risk of being infected with HIV. If you choose to begin PrEP, you should first be tested for HIV. You should then be tested every 3 months for as long as you are taking PrEP.  Osteoporosis is a disease in which the bones lose minerals and strength   with aging. This can result in serious bone fractures or breaks. The risk of osteoporosis can be identified using a bone density scan. Women ages 65 years and over and women at risk for fractures or osteoporosis should discuss screening with their health care providers. Ask your health care provider whether you should take a calcium supplement or vitamin D to reduce the rate of osteoporosis.  Menopause can be associated with physical symptoms and risks. Hormone replacement therapy is available to decrease symptoms and risks. You should talk to your health care provider about whether hormone replacement therapy is right for you.  Use sunscreen. Apply sunscreen liberally and repeatedly throughout the day. You should seek shade when your shadow is shorter than you. Protect yourself by wearing long sleeves, pants, a wide-brimmed hat, and sunglasses year round, whenever you are outdoors.  Once a month, do a whole body skin exam, using a mirror to look at the skin on your back. Tell your health care provider of new moles, moles that have irregular borders, moles that are larger than a pencil eraser, or moles that have changed in shape or color.  Stay current with required vaccines (immunizations).  Influenza vaccine. All adults should be immunized every year.  Tetanus, diphtheria, and acellular pertussis (Td, Tdap) vaccine. Pregnant women should  receive 1 dose of Tdap vaccine during each pregnancy. The dose should be obtained regardless of the length of time since the last dose. Immunization is preferred during the 27th-36th week of gestation. An adult who has not previously received Tdap or who does not know her vaccine status should receive 1 dose of Tdap. This initial dose should be followed by tetanus and diphtheria toxoids (Td) booster doses every 10 years. Adults with an unknown or incomplete history of completing a 3-dose immunization series with Td-containing vaccines should begin or complete a primary immunization series including a Tdap dose. Adults should receive a Td booster every 10 years.  Varicella vaccine. An adult without evidence of immunity to varicella should receive 2 doses or a second dose if she has previously received 1 dose. Pregnant females who do not have evidence of immunity should receive the first dose after pregnancy. This first dose should be obtained before leaving the health care facility. The second dose should be obtained 4-8 weeks after the first dose.  Human papillomavirus (HPV) vaccine. Females aged 13-26 years who have not received the vaccine previously should obtain the 3-dose series. The vaccine is not recommended for use in pregnant females. However, pregnancy testing is not needed before receiving a dose. If a female is found to be pregnant after receiving a dose, no treatment is needed. In that case, the remaining doses should be delayed until after the pregnancy. Immunization is recommended for any person with an immunocompromised condition through the age of 26 years if she did not get any or all doses earlier. During the 3-dose series, the second dose should be obtained 4-8 weeks after the first dose. The third dose should be obtained 24 weeks after the first dose and 16 weeks after the second dose.  Zoster vaccine. One dose is recommended for adults aged 60 years or older unless certain conditions are  present.  Measles, mumps, and rubella (MMR) vaccine. Adults born before 1957 generally are considered immune to measles and mumps. Adults born in 1957 or later should have 1 or more doses of MMR vaccine unless there is a contraindication to the vaccine or there is laboratory evidence of immunity to   each of the three diseases. A routine second dose of MMR vaccine should be obtained at least 28 days after the first dose for students attending postsecondary schools, health care workers, or international travelers. People who received inactivated measles vaccine or an unknown type of measles vaccine during 1963-1967 should receive 2 doses of MMR vaccine. People who received inactivated mumps vaccine or an unknown type of mumps vaccine before 1979 and are at high risk for mumps infection should consider immunization with 2 doses of MMR vaccine. For females of childbearing age, rubella immunity should be determined. If there is no evidence of immunity, females who are not pregnant should be vaccinated. If there is no evidence of immunity, females who are pregnant should delay immunization until after pregnancy. Unvaccinated health care workers born before 1957 who lack laboratory evidence of measles, mumps, or rubella immunity or laboratory confirmation of disease should consider measles and mumps immunization with 2 doses of MMR vaccine or rubella immunization with 1 dose of MMR vaccine.  Pneumococcal 13-valent conjugate (PCV13) vaccine. When indicated, a person who is uncertain of her immunization history and has no record of immunization should receive the PCV13 vaccine. An adult aged 19 years or older who has certain medical conditions and has not been previously immunized should receive 1 dose of PCV13 vaccine. This PCV13 should be followed with a dose of pneumococcal polysaccharide (PPSV23) vaccine. The PPSV23 vaccine dose should be obtained at least 8 weeks after the dose of PCV13 vaccine. An adult aged 19  years or older who has certain medical conditions and previously received 1 or more doses of PPSV23 vaccine should receive 1 dose of PCV13. The PCV13 vaccine dose should be obtained 1 or more years after the last PPSV23 vaccine dose.  Pneumococcal polysaccharide (PPSV23) vaccine. When PCV13 is also indicated, PCV13 should be obtained first. All adults aged 65 years and older should be immunized. An adult younger than age 65 years who has certain medical conditions should be immunized. Any person who resides in a nursing home or long-term care facility should be immunized. An adult smoker should be immunized. People with an immunocompromised condition and certain other conditions should receive both PCV13 and PPSV23 vaccines. People with human immunodeficiency virus (HIV) infection should be immunized as soon as possible after diagnosis. Immunization during chemotherapy or radiation therapy should be avoided. Routine use of PPSV23 vaccine is not recommended for American Indians, [place] Natives, or people younger than 65 years unless there are medical conditions that require PPSV23 vaccine. When indicated, people who have unknown immunization and have no record of immunization should receive PPSV23 vaccine. One-time revaccination 5 years after the first dose of PPSV23 is recommended for people aged 19-64 years who have chronic kidney failure, nephrotic syndrome, asplenia, or immunocompromised conditions. People who received 1-2 doses of PPSV23 before age 65 years should receive another dose of PPSV23 vaccine at age 65 years or later if at least 5 years have passed since the previous dose. Doses of PPSV23 are not needed for people immunized with PPSV23 at or after age 65 years.  Meningococcal vaccine. Adults with asplenia or persistent complement component deficiencies should receive 2 doses of quadrivalent meningococcal conjugate (MenACWY-D) vaccine. The doses should be obtained at least 2 months apart.  Microbiologists working with certain meningococcal bacteria, military recruits, people at risk during an outbreak, and people who travel to or live in countries with a high rate of meningitis should be immunized. A first-year college student up through age   21 years who is living in a residence hall should receive a dose if she did not receive a dose on or after her 16th birthday. Adults who have certain high-risk conditions should receive one or more doses of vaccine.  Hepatitis A vaccine. Adults who wish to be protected from this disease, have certain high-risk conditions, work with hepatitis A-infected animals, work in hepatitis A research labs, or travel to or work in countries with a high rate of hepatitis A should be immunized. Adults who were previously unvaccinated and who anticipate close contact with an international adoptee during the first 60 days after arrival in the [place] from a country with a high rate of hepatitis A should be immunized.  Hepatitis B vaccine. Adults who wish to be protected from this disease, have certain high-risk conditions, may be exposed to blood or other infectious body fluids, are household contacts or sex partners of hepatitis B positive people, are clients or workers in certain care facilities, or travel to or work in countries with a high rate of hepatitis B should be immunized.  Haemophilus influenzae type b (Hib) vaccine. A previously unvaccinated person with asplenia or sickle cell disease or having a scheduled splenectomy should receive 1 dose of Hib vaccine. Regardless of previous immunization, a recipient of a hematopoietic stem cell transplant should receive a 3-dose series 6-12 months after her successful transplant. Hib vaccine is not recommended for adults with HIV infection. Preventive Services / Frequency Ages 64 to 68 years  Blood pressure check.** / Every 1 to 2 years.  Lipid and cholesterol check.** / Every 5 years beginning at age  22.  Clinical breast exam.** / Every 3 years for women in their 88s and 53s.  BRCA-related cancer risk assessment.** / For women who have family members with a BRCA-related cancer (breast, ovarian, tubal, or peritoneal cancers).  Pap test.** / Every 2 years from ages 90 through 51. Every 3 years starting at age 21 through age 56 or 3 with a history of 3 consecutive normal Pap tests.  HPV screening.** / Every 3 years from ages 24 through ages 1 to 46 with a history of 3 consecutive normal Pap tests.  Hepatitis C blood test.** / For any individual with known risks for hepatitis C.  Skin self-exam. / Monthly.  Influenza vaccine. / Every year.  Tetanus, diphtheria, and acellular pertussis (Tdap, Td) vaccine.** / Consult your health care provider. Pregnant women should receive 1 dose of Tdap vaccine during each pregnancy. 1 dose of Td every 10 years.  Varicella vaccine.** / Consult your health care provider. Pregnant females who do not have evidence of immunity should receive the first dose after pregnancy.  HPV vaccine. / 3 doses over 6 months, if 72 and younger. The vaccine is not recommended for use in pregnant females. However, pregnancy testing is not needed before receiving a dose.  Measles, mumps, rubella (MMR) vaccine.** / You need at least 1 dose of MMR if you were born in 1957 or later. You may also need a 2nd dose. For females of childbearing age, rubella immunity should be determined. If there is no evidence of immunity, females who are not pregnant should be vaccinated. If there is no evidence of immunity, females who are pregnant should delay immunization until after pregnancy.  Pneumococcal 13-valent conjugate (PCV13) vaccine.** / Consult your health care provider.  Pneumococcal polysaccharide (PPSV23) vaccine.** / 1 to 2 doses if you smoke cigarettes or if you have certain conditions.  Meningococcal vaccine.** /  1 dose if you are age 19 to 21 years and a first-year college  student living in a residence hall, or have one of several medical conditions, you need to get vaccinated against meningococcal disease. You may also need additional booster doses.  Hepatitis A vaccine.** / Consult your health care provider.  Hepatitis B vaccine.** / Consult your health care provider.  Haemophilus influenzae type b (Hib) vaccine.** / Consult your health care provider. Ages 40 to 64 years  Blood pressure check.** / Every 1 to 2 years.  Lipid and cholesterol check.** / Every 5 years beginning at age 20 years.  Lung cancer screening. / Every year if you are aged 55-80 years and have a 30-pack-year history of smoking and currently smoke or have quit within the past 15 years. Yearly screening is stopped once you have quit smoking for at least 15 years or develop a health problem that would prevent you from having lung cancer treatment.  Clinical breast exam.** / Every year after age 40 years.  BRCA-related cancer risk assessment.** / For women who have family members with a BRCA-related cancer (breast, ovarian, tubal, or peritoneal cancers).  Mammogram.** / Every year beginning at age 40 years and continuing for as long as you are in good health. Consult with your health care provider.  Pap test.** / Every 3 years starting at age 30 years through age 65 or 70 years with a history of 3 consecutive normal Pap tests.  HPV screening.** / Every 3 years from ages 30 years through ages 65 to 70 years with a history of 3 consecutive normal Pap tests.  Fecal occult blood test (FOBT) of stool. / Every year beginning at age 50 years and continuing until age 75 years. You may not need to do this test if you get a colonoscopy every 10 years.  Flexible sigmoidoscopy or colonoscopy.** / Every 5 years for a flexible sigmoidoscopy or every 10 years for a colonoscopy beginning at age 50 years and continuing until age 75 years.  Hepatitis C blood test.** / For all people born from 1945 through  1965 and any individual with known risks for hepatitis C.  Skin self-exam. / Monthly.  Influenza vaccine. / Every year.  Tetanus, diphtheria, and acellular pertussis (Tdap/Td) vaccine.** / Consult your health care provider. Pregnant women should receive 1 dose of Tdap vaccine during each pregnancy. 1 dose of Td every 10 years.  Varicella vaccine.** / Consult your health care provider. Pregnant females who do not have evidence of immunity should receive the first dose after pregnancy.  Zoster vaccine.** / 1 dose for adults aged 60 years or older.  Measles, mumps, rubella (MMR) vaccine.** / You need at least 1 dose of MMR if you were born in 1957 or later. You may also need a 2nd dose. For females of childbearing age, rubella immunity should be determined. If there is no evidence of immunity, females who are not pregnant should be vaccinated. If there is no evidence of immunity, females who are pregnant should delay immunization until after pregnancy.  Pneumococcal 13-valent conjugate (PCV13) vaccine.** / Consult your health care provider.  Pneumococcal polysaccharide (PPSV23) vaccine.** / 1 to 2 doses if you smoke cigarettes or if you have certain conditions.  Meningococcal vaccine.** / Consult your health care provider.  Hepatitis A vaccine.** / Consult your health care provider.  Hepatitis B vaccine.** / Consult your health care provider.  Haemophilus influenzae type b (Hib) vaccine.** / Consult your health care provider. Ages 65   years and over  Blood pressure check.** / Every 1 to 2 years.  Lipid and cholesterol check.** / Every 5 years beginning at age 48 years.  Lung cancer screening. / Every year if you are aged 68-80 years and have a 30-pack-year history of smoking and currently smoke or have quit within the past 15 years. Yearly screening is stopped once you have quit smoking for at least 15 years or develop a health problem that would prevent you from having lung cancer  treatment.  Clinical breast exam.** / Every year after age 4 years.  BRCA-related cancer risk assessment.** / For women who have family members with a BRCA-related cancer (breast, ovarian, tubal, or peritoneal cancers).  Mammogram.** / Every year beginning at age 69 years and continuing for as long as you are in good health. Consult with your health care provider.  Pap test.** / Every 3 years starting at age 33 years through age 32 or 70 years with 3 consecutive normal Pap tests. Testing can be stopped between 65 and 70 years with 3 consecutive normal Pap tests and no abnormal Pap or HPV tests in the past 10 years.  HPV screening.** / Every 3 years from ages 32 years through ages 1 or 33 years with a history of 3 consecutive normal Pap tests. Testing can be stopped between 65 and 70 years with 3 consecutive normal Pap tests and no abnormal Pap or HPV tests in the past 10 years.  Fecal occult blood test (FOBT) of stool. / Every year beginning at age 77 years and continuing until age 27 years. You may not need to do this test if you get a colonoscopy every 10 years.  Flexible sigmoidoscopy or colonoscopy.** / Every 5 years for a flexible sigmoidoscopy or every 10 years for a colonoscopy beginning at age 61 years and continuing until age 68 years.  Hepatitis C blood test.** / For all people born from [birth year] through 1967 and any individual with known risks for hepatitis C.  Osteoporosis screening.** / A one-time screening for women ages 73 years and over and women at risk for fractures or osteoporosis.  Skin self-exam. / Monthly.  Influenza vaccine. / Every year.  Tetanus, diphtheria, and acellular pertussis (Tdap/Td) vaccine.** / 1 dose of Td every 10 years.  Varicella vaccine.** / Consult your health care provider.  Zoster vaccine.** / 1 dose for adults aged 67 years or older.  Pneumococcal 13-valent conjugate (PCV13) vaccine.** / Consult your health care provider.  Pneumococcal  polysaccharide (PPSV23) vaccine.** / 1 dose for all adults aged 43 years and older.  Meningococcal vaccine.** / Consult your health care provider.  Hepatitis A vaccine.** / Consult your health care provider.  Hepatitis B vaccine.** / Consult your health care provider.  Haemophilus influenzae type b (Hib) vaccine.** / Consult your health care provider. ** Family history and personal history of risk and conditions may change your health care provider's recommendations. Document Released: 07/02/2001 Document Revised: 09/20/2013 Document Reviewed: 10/01/2010 [facility] Patient Information 2015 [place], [place]. This information is not intended to replace advice given to you by your health care provider. Make sure you discuss any questions you have with your health care provider.  Thank you for enrolling in [facility]. Please follow the instructions below to securely access your online medical record. MyChart allows you to send messages to your doctor, view your test results, manage appointments, and more.   How Do I Sign Up? 1. In your [employer], go to [employer] and  enter [email or web address]. 2. Click on the Sign Up Now link in the Sign In box. You will see the New Member Sign Up page. 3. Enter your MyChart Access Code exactly as it appears below. You will not need to use this code after you've completed the sign-up process. If you do not sign up before the expiration date, you must request a new code.  MyChart Access Code: [email or web address] Expires: 02/26/2014  8:37 AM  4. Enter your Social Security Number ([email or web address]) and Date of Birth (mm/dd/yyyy) as indicated and click Submit. You will be taken to the next sign-up page. 5. Create a MyChart ID. This will be your MyChart login ID and cannot be changed, so think of one that is secure and easy to remember. 6. Create a MyChart password. You can change your password at any time. 7. Enter your Password Reset Question and Answer.  This can be used at a later time if you forget your password.  8. Enter your e-mail address. You will receive e-mail notification when new information is available in [facility]. 9. Click Sign Up. You can now view your medical record.   Additional Information Remember, MyChart is NOT to be used for urgent needs. For medical emergencies, dial 911.

## 2014-01-25 NOTE — Progress Notes (Signed)
    GYNECOLOGY CLINIC ANNUAL PREVENTATIVE CARE ENCOUNTER NOTE  Subjective:     Sally Oliver is a 23 y.o. G11 female here for a routine annual gynecologic exam.  The patient reports no gynecologic problems. She has a history of a left breast fibroadenoma that was partially resected in 2011, and re-biopsied in 06/2012 with benign findings. She reports this is stable in size, no other symptoms.    Gynecologic History No LMP recorded. Patient is not currently having periods (Reason: Oral contraceptives). Last Pap: 01/21/13. Results were: Normal pap, negative GC and Chlam.   The following portions of the patient's history were reviewed and updated as appropriate: allergies, current medications, past family history, past medical history, past social history, past surgical history and problem list. Has not received Gardasil series and is not interested in it at this point.  Review of Systems A comprehensive review of systems was negative.    Objective:   BP 112/80  Pulse 94  Ht 5\' 1"  (1.549 m)  Wt 106 lb (48.081 kg)  BMI 20.04 kg/m2 GENERAL: Well-developed, well-nourished female in no acute distress.  HEENT: Normocephalic, atraumatic. Sclerae anicteric.  NECK: Supple. Normal thyroid.  LUNGS: Clear to auscultation bilaterally.  HEART: Regular rate and rhythm. BREASTS: Symmetric in size. 3 cm x 2 cm solid, mobile mass palpated in inner, upper quadrant (around 10 o'clock) of the left breast. No other masses, skin changes, nipple drainage, or lymphadenopathy. ABDOMEN: Soft, nontender, nondistended. No organomegaly. PELVIC: Normal external female genitalia. Vagina is pink and rugated.  Normal discharge. Normal cervix contour. Pap smear obtained. Uterus is normal in size and retroverted. No adnexal mass or tenderness.  EXTREMITIES: No cyanosis, clubbing, or edema, 2+ distal pulses.   Assessment:   Annual gynecologic examination   Plan:   Pap done, will follow up results and manage  accordingly. Routine preventative health maintenance measures emphasized   Verita Schneiders, MD, [place] Attending [facility]

## 2014-01-27 LAB — CYTOLOGY - PAP

## 2014-03-17 ENCOUNTER — Encounter: Payer: Self-pay | Admitting: Internal Medicine

## 2014-03-17 ENCOUNTER — Ambulatory Visit (INDEPENDENT_AMBULATORY_CARE_PROVIDER_SITE_OTHER): Payer: PRIVATE HEALTH INSURANCE | Admitting: Internal Medicine

## 2014-03-17 VITALS — BP 98/56 | HR 78 | Temp 98.2°F | Wt 105.0 lb

## 2014-03-17 DIAGNOSIS — R490 Dysphonia: Secondary | ICD-10-CM

## 2014-03-17 NOTE — Progress Notes (Signed)
Pre visit review using our clinic review tool, if applicable. No additional management support is needed unless otherwise documented below in the visit note. 

## 2014-03-17 NOTE — Patient Instructions (Addendum)
Laryngitis °At the top of your windpipe is your voice box. It is the source of your voice. Inside your voice box are 2 bands of muscles called vocal cords. When you breathe, your vocal cords are relaxed and open so that air can get into the lungs. When you decide to say something, these cords come together and vibrate. The sound from these vibrations goes into your throat and comes out through your mouth as sound.  °Laryngitis is an inflammation of the vocal cords that causes hoarseness, cough, loss of voice, sore throat, and dry throat. Laryngitis can be temporary (acute) or long-term (chronic). Most cases of acute laryngitis improve with time.Chronic laryngitis lasts for more than 3 weeks. °CAUSES °Laryngitis can often be related to excessive smoking, talking, or yelling, as well as inhalation of toxic fumes and allergies. Acute laryngitis is usually caused by a viral infection, vocal strain, measles or mumps, or bacterial infections. Chronic laryngitis is usually caused by vocal cord strain, vocal cord injury, postnasal drip, growths on the vocal cords, or acid reflux. °SYMPTOMS  °· Cough. °· Sore throat. °· Dry throat. °RISK FACTORS °· Respiratory infections. °· Exposure to irritating substances, such as cigarette smoke, excessive amounts of alcohol, stomach acids, and workplace chemicals. °· Voice trauma, such as vocal cord injury from shouting or speaking too loud. °DIAGNOSIS  °Your cargiver will perform a physical exam. During the physical exam, your caregiver will examine your throat. The most common sign of laryngitis is hoarseness. Laryngoscopy may be necessary to confirm the diagnosis of this condition. This procedure allows your caregiver to look into the larynx. °HOME CARE INSTRUCTIONS °· Drink enough fluids to keep your urine clear or pale yellow. °· Rest until you no longer have symptoms or as directed by your caregiver. °· Breathe in moist air. °· Take all medicine as directed by your  caregiver. °· Do not smoke. °· Talk as little as possible (this includes whispering). °· Write on paper instead of talking until your voice is back to normal. °· Follow up with your caregiver if your condition has not improved after 10 days. °SEEK MEDICAL CARE IF:  °· You have trouble breathing. °· You cough up blood. °· You have persistent fever. °· You have increasing pain. °· You have difficulty swallowing. °MAKE SURE YOU: °· Understand these instructions. °· Will watch your condition. °· Will get help right away if you are not doing well or get worse. °Document Released: 05/06/2005 Document Revised: 07/29/2011 Document Reviewed: 07/12/2010 °ExitCare® Patient Information ©2015 [employer]. This information is not intended to replace advice given to you by your health care provider. Make sure you discuss any questions you have with your health care provider. ° °

## 2014-03-17 NOTE — Progress Notes (Signed)
Subjective:    Patient ID: Sally Oliver, female    DOB: March 23, 1991, 23 y.o.   MRN: [id number]  HPI  Pt presents to the clinic today with c/o hoarseness. She reports this started about 1 month ago. She denies any associated URI symptoms. She has not recently started or stopped any new medications. She has tried zyrtec, hot tea and salt water gargles without any relief. She does not consume much alcohol. She does not smoke. She denies any reflux symptoms. 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  Review of Systems      Past Medical History  Diagnosis Date  . Chicken pox   . Migraine headache     Current Outpatient Prescriptions  Medication Sig Dispense Refill  . cetirizine (ZYRTEC) 10 MG tablet Take 10 mg by mouth daily.      Marland Kitchen LOMEDIA 24 FE 1-20 MG-MCG(24) tablet TAKE 1 TABLET BY MOUTH DAILY.  28 tablet  11   No current facility-administered medications for this visit.    No Known Allergies  Family History  Problem Relation Age of Onset  . Migraines Mother   . Heart disease Father   . Stroke Father   . Arthritis Father     History   Social History  . Marital Status: Single    Spouse Name: N/A    Number of Children: N/A  . Years of Education: N/A   Occupational History  . Not on file.   Social History Main Topics  . Smoking status: Never Smoker   . Smokeless tobacco: Never Used  . Alcohol Use: Yes     Comment: social-occasional  . Drug Use: No  . Sexual Activity: Yes    Partners: Male    Birth Control/ Protection: OCP   Other Topics Concern  . Not on file   Social History Narrative  . No narrative on file     Constitutional: Denies fever, malaise, fatigue, headache or abrupt weight changes.  HEENT: Pt reports hoarseness. Denies eye pain, eye redness, ear pain, ringing in the ears, wax buildup, runny nose, nasal congestion, bloody nose, or sore throat. Respiratory: Denies  difficulty breathing, shortness of breath, cough or sputum production.   Cardiovascular: Denies chest pain, chest tightness, palpitations or swelling in the hands or feet.  Gastrointestinal: Denies abdominal pain, bloating, constipation, diarrhea or blood in the stool.    No other specific complaints in a complete review of systems (except as listed in HPI above).  Objective:   Physical Exam  BP 98/56  Pulse 78  Temp(Src) 98.2 F (36.8 C) (Oral)  Wt 105 lb (47.628 kg)  SpO2 98% Wt Readings from Last 3 Encounters:  03/17/14 105 lb (47.628 kg)  01/25/14 106 lb (48.081 kg)  12/28/13 103 lb 8 oz (46.947 kg)    General: Appears her stated age, well developed, well nourished in NAD. HEENT: Head: normal shape and size; Ears: Tm's gray and intact, normal light reflex; Nose: mucosa pink and moist, septum midline; Throat/Mouth: Teeth present, mucosa pink and moist, no exudate, lesions or ulcerations noted. Voice does sound hoarse. Neck: Normal range of motion. Neck supple, trachea midline. No massses, lumps or thyromegaly present.  Cardiovascular: Normal rate and rhythm. S1,S2 noted.  No murmur, rubs or gallops noted.  Pulmonary/Chest: Normal effort and positive vesicular breath sounds. No respiratory distress. No wheezes, rales or ronchi noted.  Abdomen: Soft and nontender. Normal  bowel sounds, no bruits noted. No distention or masses noted. Liver, spleen and kidneys non palpable.   BMET    Component Value Date/Time   NA 140 03/09/2011 2017   K 4.5 03/09/2011 2017   CL 107 03/09/2011 2017   GLUCOSE 95 03/09/2011 2017   BUN 11 03/09/2011 2017   CREATININE 0.80 03/09/2011 2017    Lipid Panel  No results found for this basename: chol, trig, hdl, cholhdl, vldl, ldlcalc    CBC    Component Value Date/Time   HGB 16.0* 03/09/2011 2017   HCT 47.0* 03/09/2011 2017    Hgb A1C No results found for this basename: HGBA1C         Assessment & Plan:   Hoarseness:  ?  Reflux Will trial PPI- advised her to get Prilosec OTC take 1 daily in am x 2 weeks If no improvement will refer to ENT for further evaluation  RTC in 2 weeks if symptoms persist or worsen

## 2014-03-30 ENCOUNTER — Other Ambulatory Visit: Payer: Self-pay | Admitting: Internal Medicine

## 2014-03-30 ENCOUNTER — Telehealth: Payer: Self-pay

## 2014-03-30 DIAGNOSIS — R490 Dysphonia: Secondary | ICD-10-CM
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
## 2014-03-30 NOTE — Telephone Encounter (Signed)
Referral placed.

## 2014-04-18 ENCOUNTER — Telehealth: Payer: Self-pay | Admitting: *Deleted

## 2014-04-18 DIAGNOSIS — Z30011 Encounter for initial prescription of contraceptive pills: Secondary | ICD-10-CM

## 2014-04-18 MED ORDER — NORGESTIMATE-ETH ESTRADIOL 0.25-35 MG-MCG PO TABS: 1.0000 | ORAL_TABLET | Freq: Every day | ORAL | Status: DC

## 2014-04-18 NOTE — Telephone Encounter (Signed)
Patient is requesting we change her ocp due to change of insurance her copay has gone from nothing to 50 dollars.

## 2014-05-04 ENCOUNTER — Encounter: Payer: Self-pay | Admitting: Otolaryngology

## 2014-05-20 ENCOUNTER — Encounter: Payer: Self-pay | Admitting: Otolaryngology

## 2014-10-10 ENCOUNTER — Telehealth: Payer: Self-pay | Admitting: Obstetrics and Gynecology

## 2014-10-10 DIAGNOSIS — Z30011 Encounter for initial prescription of contraceptive pills: Secondary | ICD-10-CM

## 2014-10-10 MED ORDER — NORGESTIMATE-ETH ESTRADIOL 0.25-35 MG-MCG PO TABS: 1.0000 | ORAL_TABLET | Freq: Every day | ORAL | Status: DC

## 2014-10-10 NOTE — Telephone Encounter (Signed)
Patient called stating that she moved and needs her Rx transferred to [facility].

## 2014-10-30 ENCOUNTER — Encounter (HOSPITAL_COMMUNITY): Payer: Self-pay

## 2014-10-30 ENCOUNTER — Emergency Department (HOSPITAL_COMMUNITY)
Admission: EM | Admit: 2014-10-30 | Discharge: 2014-10-30 | Disposition: A | Discharge status: Home / Self Care | Payer: PRIVATE HEALTH INSURANCE | Attending: Emergency Medicine | Admitting: Emergency Medicine

## 2014-10-30 DIAGNOSIS — Z8619 Personal history of other infectious and parasitic diseases: Secondary | ICD-10-CM | POA: Diagnosis not present

## 2014-10-30 DIAGNOSIS — Y998 Other external cause status: Secondary | ICD-10-CM | POA: Diagnosis not present

## 2014-10-30 DIAGNOSIS — Z8679 Personal history of other diseases of the circulatory system: Secondary | ICD-10-CM | POA: Diagnosis not present

## 2014-10-30 DIAGNOSIS — T148XXA Other injury of unspecified body region, initial encounter: Secondary | ICD-10-CM

## 2014-10-30 DIAGNOSIS — Y92410 Unspecified street and highway as the place of occurrence of the external cause: Secondary | ICD-10-CM | POA: Diagnosis not present

## 2014-10-30 DIAGNOSIS — Y9389 Activity, other specified: Secondary | ICD-10-CM | POA: Diagnosis not present

## 2014-10-30 DIAGNOSIS — Z79899 Other long term (current) drug therapy: Secondary | ICD-10-CM | POA: Insufficient documentation

## 2014-10-30 DIAGNOSIS — S3992XA Unspecified injury of lower back, initial encounter: Secondary | ICD-10-CM | POA: Diagnosis present

## 2014-10-30 DIAGNOSIS — S29012A Strain of muscle and tendon of back wall of thorax, initial encounter: Secondary | ICD-10-CM | POA: Diagnosis not present

## 2014-10-30 MED ORDER — IBUPROFEN 800 MG PO TABS: 800.0000 mg | ORAL_TABLET | Freq: Three times a day (TID) | ORAL | Status: DC

## 2014-10-30 MED ORDER — CYCLOBENZAPRINE HCL 10 MG PO TABS: 10.0000 mg | ORAL_TABLET | Freq: Two times a day (BID) | ORAL | Status: DC | PRN
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
## 2014-11-07 ENCOUNTER — Encounter: Payer: Self-pay | Admitting: Family

## 2014-11-07 ENCOUNTER — Other Ambulatory Visit (INDEPENDENT_AMBULATORY_CARE_PROVIDER_SITE_OTHER): Payer: PRIVATE HEALTH INSURANCE

## 2014-11-07 ENCOUNTER — Ambulatory Visit (INDEPENDENT_AMBULATORY_CARE_PROVIDER_SITE_OTHER): Payer: PRIVATE HEALTH INSURANCE | Admitting: Family

## 2014-11-07 VITALS — BP 104/78 | HR 90 | Temp 97.7°F | Resp 18 | Ht 61.0 in | Wt 106.8 lb

## 2014-11-07 DIAGNOSIS — Z0000 Encounter for general adult medical examination without abnormal findings: Secondary | ICD-10-CM

## 2014-11-07 LAB — CBC
HCT: 41.5 % (ref 36.0–46.0)
Hemoglobin: 13.8 g/dL (ref 12.0–15.0)
MCHC: 33.2 g/dL (ref 30.0–36.0)
MCV: 84.6 fl (ref 78.0–100.0)
PLATELETS: 243 10*3/uL (ref 150.0–400.0)
RBC: 4.9 Mil/uL (ref 3.87–5.11)
RDW: 12.5 % (ref 11.5–15.5)
WBC: 8.2 10*3/uL (ref 4.0–10.5)

## 2014-11-07 LAB — LIPID PANEL
CHOL/HDL RATIO: 2
Cholesterol: 169 mg/dL (ref 0–200)
HDL: 84.4 mg/dL (ref >39.00)
LDL CALC: 76 mg/dL (ref 0–99)
NonHDL: 84.6
TRIGLYCERIDES: 42 mg/dL (ref 0.0–149.0)
VLDL: 8.4 mg/dL (ref 0.0–40.0)

## 2014-11-07 LAB — HEMOGLOBIN A1C: Hgb A1c MFr Bld: 5.3 % (ref 4.6–6.5)

## 2014-11-07 LAB — COMPREHENSIVE METABOLIC PANEL
ALT: 12 U/L (ref 0–35)
AST: 18 U/L (ref 0–37)
Albumin: 4.8 g/dL (ref 3.5–5.2)
Alkaline Phosphatase: 51 U/L (ref 39–117)
BILIRUBIN TOTAL: 0.5 mg/dL (ref 0.2–1.2)
BUN: 8 mg/dL (ref 6–23)
CALCIUM: 9.7 mg/dL (ref 8.4–10.5)
CHLORIDE: 107 meq/L (ref 96–112)
CO2: 26 mEq/L (ref 19–32)
CREATININE: 0.73 mg/dL (ref 0.40–1.20)
GFR: 102.4 mL/min (ref >60.00)
Glucose, Bld: 88 mg/dL (ref 70–99)
Potassium: 4.6 mEq/L (ref 3.5–5.1)
Sodium: 139 mEq/L (ref 135–145)
Total Protein: 7.1 g/dL (ref 6.0–8.3)

## 2014-11-07 LAB — TSH: TSH: 0.89 u[IU]/mL (ref 0.35–4.50)

## 2014-11-07 NOTE — Progress Notes (Signed)
Subjective:    Patient ID: Sally Oliver, female    DOB: 11/04/1990, 23 y.o.   MRN: [id number]  Chief Complaint  Patient presents with  . Establish Care    Needs wellness visit for insurance    HPI:  Sally Oliver is a 24 y.o. female who presents today for an annual wellness visit.   1) Health Maintenance -   Diet - Averages about 3 meals and snacks per day consisting of fruits, vegetables, chicken, red meat; occasional processed/fast food; Caffeine intake is rare  Exercise - 2-3 times per week - [employer].   2) Preventative Exams / Immunizations:  Dental -- Up to date  Vision -- Due for exam   Health Maintenance  Topic Date Due  . HIV Screening  10/26/2003  . INFLUENZA VACCINE  12/19/2014  . TETANUS/TDAP  12/18/2016  . PAP SMEAR  01/25/2017     There is no immunization history on file for this patient.                                                                     No Known Allergies   Outpatient Prescriptions Prior to Visit  Medication Sig Dispense Refill  . cyclobenzaprine (FLEXERIL) 10 MG tablet Take 1 tablet (10 mg total) by mouth 2 (two) times daily as needed for muscle spasms. 20 tablet 0  . ibuprofen (ADVIL,MOTRIN) 800 MG tablet Take 1 tablet (800 mg total) by mouth 3 (three) times daily. 21 tablet 0  . cetirizine (ZYRTEC) 10 MG tablet Take 10 mg by mouth daily.    Marland Kitchen LOMEDIA 24 FE 1-20 MG-MCG(24) tablet TAKE 1 TABLET BY MOUTH DAILY. 28 tablet 11  . norgestimate-ethinyl estradiol (ORTHO-CYCLEN,SPRINTEC,PREVIFEM) 0.25-35 MG-MCG tablet Take 1 tablet by mouth daily. 1 Package 11   No facility-administered medications prior to visit.     Past Medical History  Diagnosis Date  . Chicken pox   . Migraine headache   . Scoliosis      Past Surgical History  Procedure Laterality Date  . Wisdom tooth extraction    . Breast lumpectomy  2011    left  . Breast biopsy  2014    left     Family History  Problem Relation Age of Onset  .  Migraines Mother   . Heart disease Father   . Stroke Father   . Arthritis Father   . Healthy Maternal Grandmother   . Stroke Maternal Grandfather   . Diverticulitis Paternal Grandmother      History   Social History  . Marital Status: Married    Spouse Name: N/A  . Number of Children: 0  . Years of Education: 16   Occupational History  . [employer]     Social History Main Topics  . Smoking status: Never Smoker   . Smokeless tobacco: Never Used  . Alcohol Use: Yes     Comment: social-occasional  . Drug Use: No  . Sexual Activity:    Partners: Male    Birth Control/ Protection: OCP, None   Other Topics Concern  . Not on file   Social History Narrative   Fun: Owns own business, walking the dogs.    Denies religious beliefs effecting health care.  Review of Systems  Constitutional: Denies fever, chills, fatigue, or significant weight gain/loss. HENT: Head: Denies headache or neck pain Ears: Denies changes in hearing, ringing in ears, earache, drainage Nose: Denies discharge, stuffiness, itching, nosebleed, sinus pain Throat: Denies sore throat, hoarseness, dry mouth, sores, thrush Eyes: Denies loss/changes in vision, pain, redness, blurry/double vision, flashing lights Cardiovascular: Denies chest pain/discomfort, tightness, palpitations, shortness of breath with activity, difficulty lying down, swelling, sudden awakening with shortness of breath Respiratory: Denies shortness of breath, cough, sputum production, wheezing Gastrointestinal: Denies dysphasia, heartburn, change in appetite, nausea, change in bowel habits, rectal bleeding, constipation, diarrhea, yellow skin or eyes Genitourinary: Denies frequency, urgency, burning/pain, blood in urine, incontinence, change in urinary strength. Musculoskeletal: Denies muscle/joint pain, stiffness, back pain, redness or swelling of joints, trauma Skin: Denies rashes, lumps, itching, dryness, color changes, or hair/nail  changes Neurological: Denies dizziness, fainting, seizures, weakness, numbness, tingling, tremor Psychiatric - Denies nervousness, stress, depression or memory loss Endocrine: Denies heat or cold intolerance, sweating, frequent urination, excessive thirst, changes in appetite Hematologic: Denies ease of bruising or bleeding     Objective:    BP 104/78 mmHg  Pulse 90  Temp(Src) 97.7 F (36.5 C) (Oral)  Resp 18  Ht 5\' 1"  (1.549 m)  Wt 106 lb 12.8 oz (48.444 kg)  BMI 20.19 kg/m2  SpO2 98% Nursing note and vital signs reviewed.  Physical Exam  Constitutional: She is oriented to person, place, and time. She appears well-developed and well-nourished.  HENT:  Head: Normocephalic.  Right Ear: Hearing, tympanic membrane, external ear and ear canal normal.  Left Ear: Hearing, tympanic membrane, external ear and ear canal normal.  Nose: Nose normal.  Mouth/Throat: Uvula is midline, oropharynx is clear and moist and mucous membranes are normal.  Eyes: Conjunctivae and EOM are normal. Pupils are equal, round, and reactive to light.  Neck: Neck supple. No JVD present. No tracheal deviation present. No thyromegaly present.  Cardiovascular: Normal rate, regular rhythm, normal heart sounds and intact distal pulses.   Pulmonary/Chest: Effort normal and breath sounds normal.  Abdominal: Soft. Bowel sounds are normal. She exhibits no distension and no mass. There is no tenderness. There is no rebound and no guarding.  Musculoskeletal: Normal range of motion. She exhibits no edema or tenderness.  Lymphadenopathy:    She has no cervical adenopathy.  Neurological: She is alert and oriented to person, place, and time. She has normal reflexes. No cranial nerve deficit. She exhibits normal muscle tone. Coordination normal.  Skin: Skin is warm and dry.  Psychiatric: She has a normal mood and affect. Her behavior is normal. Judgment and thought content normal.       Assessment & Plan:   Problem List  Items Addressed This Visit      Other   Routine general medical examination at a health care facility - Primary    1) Anticipatory Guidance: Discussed importance of wearing a seatbelt while driving and not texting while driving; changing batteries in smoke detector at least once annually; wearing suntan lotion when outside; eating a balanced and moderate diet; getting physical activity at least 30 minutes per day.  2) Immunizations / Screenings / Labs:  All immunizations are up-to-date per recommendations. Due for a vision screen which will be scheduled independently. All other screenings are up-to-date per recommendations. Obtain CBC,  CMET, Lipid profile, A1c and TSH.   Overall medical exam. Patient has minimal respiratory cardiovascular disease. She is a good way and in good health. Continue current healthy  lifestyle behaviors. Follow-up office visit pending lab work; follow-up prevention exam in 1 year. Received paperwork per her insurance company and will be faxed upon receiving lab results.      Relevant Orders   Comprehensive metabolic panel   CBC   Lipid panel   TSH   Hemoglobin A1c

## 2014-11-07 NOTE — Progress Notes (Signed)
Pre visit review using our clinic review tool, if applicable. No additional management support is needed unless otherwise documented below in the visit note. 

## 2014-11-07 NOTE — Assessment & Plan Note (Signed)
1) Anticipatory Guidance: Discussed importance of wearing a seatbelt while driving and not texting while driving; changing batteries in smoke detector at least once annually; wearing suntan lotion when outside; eating a balanced and moderate diet; getting physical activity at least 30 minutes per day.  2) Immunizations / Screenings / Labs:  All immunizations are up-to-date per recommendations. Due for a vision screen which will be scheduled independently. All other screenings are up-to-date per recommendations. Obtain CBC,  CMET, Lipid profile, A1c and TSH.   Overall medical exam. Patient has minimal respiratory cardiovascular disease. She is a good way and in good health. Continue current healthy lifestyle behaviors. Follow-up office visit pending lab work; follow-up prevention exam in 1 year. Received paperwork per her insurance company and will be faxed upon receiving lab results.

## 2014-11-07 NOTE — Patient Instructions (Signed)
Thank you for choosing [facility].  Summary/Instructions:  Please stop by the lab on the basement level of the building for your blood work. Your results will be released to MyChart (or called to you) after review, usually within 72hours after test completion. If any changes need to be made, you will be notified at that same time.  Health Maintenance Adopting a healthy lifestyle and getting preventive care can go a long way to promote health and wellness. Talk with your health care provider about what schedule of regular examinations is right for you. This is a good chance for you to check in with your provider about disease prevention and staying healthy. In between checkups, there are plenty of things you can do on your own. Experts have done a lot of research about which lifestyle changes and preventive measures are most likely to keep you healthy. Ask your health care provider for more information. WEIGHT AND DIET  Eat a healthy diet  Be sure to include plenty of vegetables, fruits, low-fat dairy products, and lean protein.  Do not eat a lot of foods high in solid fats, added sugars, or salt.  Get regular exercise. This is one of the most important things you can do for your health.  Most adults should exercise for at least 150 minutes each week. The exercise should increase your heart rate and make you sweat (moderate-intensity exercise).  Most adults should also do strengthening exercises at least twice a week. This is in addition to the moderate-intensity exercise.  Maintain a healthy weight  Body mass index (BMI) is a measurement that can be used to identify possible weight problems. It estimates body fat based on height and weight. Your health care provider can help determine your BMI and help you achieve or maintain a healthy weight.  For females 20 years of age and older:   A BMI below 18.5 is considered underweight.  A BMI of 18.5 to 24.9 is normal.  A BMI of 25  to 29.9 is considered overweight.  A BMI of 30 and above is considered obese.  Watch levels of cholesterol and blood lipids  You should start having your blood tested for lipids and cholesterol at 26 years of age, then have this test every 5 years.  You may need to have your cholesterol levels checked more often if:  Your lipid or cholesterol levels are high.  You are older than 26 years of age.  You are at high risk for heart disease.  CANCER SCREENING   Lung Cancer  Lung cancer screening is recommended for adults 55-80 years old who are at high risk for lung cancer because of a history of smoking.  A yearly low-dose CT scan of the lungs is recommended for people who:  Currently smoke.  Have quit within the past 15 years.  Have at least a 30-pack-year history of smoking. A pack year is smoking an average of one pack of cigarettes a day for 1 year.  Yearly screening should continue until it has been 15 years since you quit.  Yearly screening should stop if you develop a health problem that would prevent you from having lung cancer treatment.  Breast Cancer  Practice breast self-awareness. This means understanding how your breasts normally appear and feel.  It also means doing regular breast self-exams. Let your health care provider know about any changes, no matter how small.  If you are in your 20s or 30s, you should have a clinical breast exam (  breast exam (CBE) by a health care provider every 1-3 years as part of a regular health exam.  If you are 40 or older, have a CBE every year. Also consider having a breast X-ray (mammogram) every year.  If you have a family history of breast cancer, talk to your health care provider about genetic screening.  If you are at high risk for breast cancer, talk to your health care provider about having an MRI and a mammogram every year.  Breast cancer gene (BRCA) assessment is recommended for women who have family members with BRCA-related  cancers. BRCA-related cancers include:  Breast.  Ovarian.  Tubal.  Peritoneal cancers.  Results of the assessment will determine the need for genetic counseling and BRCA1 and BRCA2 testing. Cervical Cancer Routine pelvic examinations to screen for cervical cancer are no longer recommended for nonpregnant women who are considered low risk for cancer of the pelvic organs (ovaries, uterus, and vagina) and who do not have symptoms. A pelvic examination may be necessary if you have symptoms including those associated with pelvic infections. Ask your health care provider if a screening pelvic exam is right for you.   The Pap test is the screening test for cervical cancer for women who are considered at risk.  If you had a hysterectomy for a problem that was not cancer or a condition that could lead to cancer, then you no longer need Pap tests.  If you are older than 65 years, and you have had normal Pap tests for the past 10 years, you no longer need to have Pap tests.  If you have had past treatment for cervical cancer or a condition that could lead to cancer, you need Pap tests and screening for cancer for at least 20 years after your treatment.  If you no longer get a Pap test, assess your risk factors if they change (such as having a new sexual partner). This can affect whether you should start being screened again.  Some women have medical problems that increase their chance of getting cervical cancer. If this is the case for you, your health care provider may recommend more frequent screening and Pap tests.  The human papillomavirus (HPV) test is another test that may be used for cervical cancer screening. The HPV test looks for the virus that can cause cell changes in the cervix. The cells collected during the Pap test can be tested for HPV.  The HPV test can be used to screen women 30 years of age and older. Getting tested for HPV can extend the interval between normal Pap tests from  three to five years.  An HPV test also should be used to screen women of any age who have unclear Pap test results.  After 26 years of age, women should have HPV testing as often as Pap tests.  Colorectal Cancer  This type of cancer can be detected and often prevented.  Routine colorectal cancer screening usually begins at 26 years of age and continues through 26 years of age.  Your health care provider may recommend screening at an earlier age if you have risk factors for colon cancer.  Your health care provider may also recommend using home test kits to check for hidden blood in the stool.  A small camera at the end of a tube can be used to examine your colon directly (sigmoidoscopy or colonoscopy). This is done to check for the earliest forms of colorectal cancer.  Routine screening usually begins at age   50.  Direct examination of the colon should be repeated every 5-10 years through 26 years of age. However, you may need to be screened more often if early forms of precancerous polyps or small growths are found. Skin Cancer  Check your skin from head to toe regularly.  Tell your health care provider about any new moles or changes in moles, especially if there is a change in a mole's shape or color.  Also tell your health care provider if you have a mole that is larger than the size of a pencil eraser.  Always use sunscreen. Apply sunscreen liberally and repeatedly throughout the day.  Protect yourself by wearing long sleeves, pants, a wide-brimmed hat, and sunglasses whenever you are outside. HEART DISEASE, DIABETES, AND HIGH BLOOD PRESSURE   Have your blood pressure checked at least every 1-2 years. High blood pressure causes heart disease and increases the risk of stroke.  If you are between 70 years and 59 years old, ask your health care provider if you should take aspirin to prevent strokes.  Have regular diabetes screenings. This involves taking a blood sample to check  your fasting blood sugar level.  If you are at a normal weight and have a low risk for diabetes, have this test once every three years after 26 years of age.  If you are overweight and have a high risk for diabetes, consider being tested at a younger age or more often. PREVENTING INFECTION  Hepatitis B  If you have a higher risk for hepatitis B, you should be screened for this virus. You are considered at high risk for hepatitis B if:  You were born in a country where hepatitis B is common. Ask your health care provider which countries are considered high risk.  Your parents were born in a high-risk country, and you have not been immunized against hepatitis B (hepatitis B vaccine).  You have HIV or AIDS.  You use needles to inject street drugs.  You live with someone who has hepatitis B.  You have had sex with someone who has hepatitis B.  You get hemodialysis treatment.  You take certain medicines for conditions, including cancer, organ transplantation, and autoimmune conditions. Hepatitis C  Blood testing is recommended for:  Everyone born from [birth year] through 1967.  Anyone with known risk factors for hepatitis C. Sexually transmitted infections (STIs)  You should be screened for sexually transmitted infections (STIs) including gonorrhea and chlamydia if:  You are sexually active and are younger than 26 years of age.  You are older than 26 years of age and your health care provider tells you that you are at risk for this type of infection.  Your sexual activity has changed since you were last screened and you are at an increased risk for chlamydia or gonorrhea. Ask your health care provider if you are at risk.  If you do not have HIV, but are at risk, it may be recommended that you take a prescription medicine daily to prevent HIV infection. This is called pre-exposure prophylaxis (PrEP). You are considered at risk if:  You are sexually active and do not regularly use  condoms or know the HIV status of your partner(s).  You take drugs by injection.  You are sexually active with a partner who has HIV. Talk with your health care provider about whether you are at high risk of being infected with HIV. If you choose to begin PrEP, you should first be tested for HIV. You should  then be tested every 3 months for as long as you are taking PrEP.  PREGNANCY   If you are premenopausal and you may become pregnant, ask your health care provider about preconception counseling.  If you may become pregnant, take 400 to 800 micrograms (mcg) of folic acid every day.  If you want to prevent pregnancy, talk to your health care provider about birth control (contraception). OSTEOPOROSIS AND MENOPAUSE   Osteoporosis is a disease in which the bones lose minerals and strength with aging. This can result in serious bone fractures. Your risk for osteoporosis can be identified using a bone density scan.  If you are 53 years of age or older, or if you are at risk for osteoporosis and fractures, ask your health care provider if you should be screened.  Ask your health care provider whether you should take a calcium or vitamin D supplement to lower your risk for osteoporosis.  Menopause may have certain physical symptoms and risks.  Hormone replacement therapy may reduce some of these symptoms and risks. Talk to your health care provider about whether hormone replacement therapy is right for you.  HOME CARE INSTRUCTIONS   Schedule regular health, dental, and eye exams.  Stay current with your immunizations.   Do not use any tobacco products including cigarettes, chewing tobacco, or electronic cigarettes.  If you are pregnant, do not drink alcohol.  If you are breastfeeding, limit how much and how often you drink alcohol.  Limit alcohol intake to no more than 1 drink per day for nonpregnant women. One drink equals 12 ounces of beer, 5 ounces of wine, or 1 ounces of hard  liquor.  Do not use street drugs.  Do not share needles.  Ask your health care provider for help if you need support or information about quitting drugs.  Tell your health care provider if you often feel depressed.  Tell your health care provider if you have ever been abused or do not feel safe at home. Document Released: 11/19/2010 Document Revised: 09/20/2013 Document Reviewed: 04/07/2013 [facility] Patient Information 2015 [place], [place]. This information is not intended to replace advice given to you by your health care provider. Make sure you discuss any questions you have with your health care provider.

## 2014-11-08 ENCOUNTER — Telehealth: Payer: Self-pay | Admitting: Family

## 2014-11-08 ENCOUNTER — Telehealth: Payer: Self-pay

## 2014-11-08 NOTE — Telephone Encounter (Signed)
Please inform patient that her lab work shows that her kidney function, liver function, electrolytes, cholesterol, thyroid, and white/red blood cells are all within the normal limits. Therefore no further assessment is needed and she can follow up for a prevention exam in 1 year or sooner for acute needs.

## 2014-11-08 NOTE — Telephone Encounter (Signed)
Faxed pts [employer] paper work for her physical and also mailed it back to pt per her request.

## 2014-11-10 NOTE — Telephone Encounter (Signed)
LVM letting pt know of the results below.

## 2015-05-17 ENCOUNTER — Ambulatory Visit (INDEPENDENT_AMBULATORY_CARE_PROVIDER_SITE_OTHER): Payer: PRIVATE HEALTH INSURANCE | Admitting: Internal Medicine

## 2015-05-17 ENCOUNTER — Encounter: Payer: Self-pay | Admitting: Internal Medicine

## 2015-05-17 VITALS — BP 102/64 | HR 59 | Temp 98.3°F | Wt 107.0 lb

## 2015-05-17 DIAGNOSIS — J069 Acute upper respiratory infection, unspecified: Secondary | ICD-10-CM

## 2015-05-17 MED ORDER — AZITHROMYCIN 250 MG PO TABS: ORAL_TABLET | ORAL | Status: DC

## 2015-05-17 MED ORDER — HYDROCODONE-HOMATROPINE 5-1.5 MG/5ML PO SYRP: 5.0000 mL | ORAL_SOLUTION | Freq: Three times a day (TID) | ORAL | Status: DC | PRN

## 2015-05-17 NOTE — Progress Notes (Signed)
HPI  Pt presents to the clinic today with c/o nasal congestion and cough. This started 2 weeks ago. The cough is non productive. She denies shortness of breath. She reports she has run low grade fevers, had chills and body aches. She has tried Claritin, Sudafed, Dayquil, Nyquil and Mucinex with minimal relief. She has no history of seasonal allergies or breathing problems. She has had sick contacts.  Review of Systems    Past Medical History  Diagnosis Date  . Chicken pox   . Migraine headache   . Scoliosis     Family History  Problem Relation Age of Onset  . Migraines Mother   . Heart disease Father   . Stroke Father   . Arthritis Father   . Healthy Maternal Grandmother   . Stroke Maternal Grandfather   . Diverticulitis Paternal Grandmother     Social History   Social History  . Marital Status: Married    Spouse Name: N/A  . Number of Children: 0  . Years of Education: 16   Occupational History  . [employer]     Social History Main Topics  . Smoking status: Never Smoker   . Smokeless tobacco: Never Used  . Alcohol Use: Yes     Comment: social-occasional  . Drug Use: No  . Sexual Activity:    Partners: Male    Birth Control/ Protection: OCP, None   Other Topics Concern  . Not on file   Social History Narrative   Fun: Owns own business, walking the dogs.    Denies religious beliefs effecting health care.     No Known Allergies   Constitutional: Positive headache, fatigue and fever. Denies abrupt weight changes.  HEENT:  Positive nasal congestion and sore throat. Denies eye redness, ear pain, ringing in the ears, wax buildup, runny nose or bloody nose. Respiratory: Positive cough. Denies difficulty breathing or shortness of breath.  Cardiovascular: Denies chest pain, chest tightness, palpitations or swelling in the hands or feet.   No other specific complaints in a complete review of systems (except as listed in HPI above).  Objective:  BP 102/64 mmHg   Pulse 59  Temp(Src) 98.3 F (36.8 C) (Oral)  Wt 107 lb (48.535 kg)  SpO2 99%   General: Appears her stated age,  in NAD. HEENT: Head: normal shape and size, no sinus tenderness noted; Eyes: sclera white, no icterus, conjunctiva pink; Ears: Tm's gray and intact, normal light reflex; Nose: mucosa pinnk and moist, septum midline; Throat/Mouth: + PND. Teeth present, mucosa erythematous and moist, no exudate noted, no lesions or ulcerations noted.  Neck:  No adenopathy noted.  Cardiovascular: Normal rate and rhythm. S1,S2 noted.  No murmur, rubs or gallops noted.  Pulmonary/Chest: Normal effort and positive vesicular breath sounds. No respiratory distress. No wheezes, rales or ronchi noted.      Assessment & Plan:  Upper Respiratory Infection:  Get some rest and drink plenty of water Do salt water gargles for the sore throat eRx for Azithromax x 5 days Rx for Hycodan cough syrup  RTC as needed or if symptoms persist.

## 2015-05-17 NOTE — Progress Notes (Signed)
Pre visit review using our clinic review tool, if applicable. No additional management support is needed unless otherwise documented below in the visit note. 

## 2015-05-17 NOTE — Patient Instructions (Signed)

## 2015-09-12 ENCOUNTER — Encounter: Payer: Self-pay | Admitting: Obstetrics & Gynecology

## 2015-09-12 ENCOUNTER — Ambulatory Visit (INDEPENDENT_AMBULATORY_CARE_PROVIDER_SITE_OTHER): Payer: PRIVATE HEALTH INSURANCE | Admitting: Obstetrics & Gynecology

## 2015-09-12 VITALS — BP 107/71 | HR 81 | Wt 106.0 lb

## 2015-09-12 DIAGNOSIS — Z3490 Encounter for supervision of normal pregnancy, unspecified, unspecified trimester: Secondary | ICD-10-CM | POA: Insufficient documentation

## 2015-09-12 DIAGNOSIS — Z3401 Encounter for supervision of normal first pregnancy, first trimester: Secondary | ICD-10-CM

## 2015-09-12 NOTE — Patient Instructions (Signed)
First Trimester of Pregnancy The first trimester of pregnancy is from week 1 until the end of week 12 (months 1 through 3). A week after a sperm fertilizes an egg, the egg will implant on the wall of the uterus. This embryo will begin to develop into a baby. Genes from you and your partner are forming the baby. The female genes determine whether the baby is a boy or a girl. At 6-8 weeks, the eyes and face are formed, and the heartbeat can be seen on ultrasound. At the end of 12 weeks, all the baby's organs are formed.  Now that you are pregnant, you will want to do everything you can to have a healthy baby. Two of the most important things are to get good prenatal care and to follow your health care provider's instructions. Prenatal care is all the medical care you receive before the baby's birth. This care will help prevent, find, and treat any problems during the pregnancy and childbirth. BODY CHANGES Your body goes through many changes during pregnancy. The changes vary from woman to woman.   You may gain or lose a couple of pounds at first.  You may feel sick to your stomach (nauseous) and throw up (vomit). If the vomiting is uncontrollable, call your health care provider.  You may tire easily.  You may develop headaches that can be relieved by medicines approved by your health care provider.  You may urinate more often. Painful urination may mean you have a bladder infection.  You may develop heartburn as a result of your pregnancy.  You may develop constipation because certain hormones are causing the muscles that push waste through your intestines to slow down.  You may develop hemorrhoids or swollen, bulging veins (varicose veins).  Your breasts may begin to grow larger and become tender. Your nipples may stick out more, and the tissue that surrounds them (areola) may become darker.  Your gums may bleed and may be sensitive to brushing and flossing.  Dark spots or blotches (chloasma,  mask of pregnancy) may develop on your face. This will likely fade after the baby is born.  Your menstrual periods will stop.  You may have a loss of appetite.  You may develop cravings for certain kinds of food.  You may have changes in your emotions from day to day, such as being excited to be pregnant or being concerned that something may go wrong with the pregnancy and baby.  You may have more vivid and strange dreams.  You may have changes in your hair. These can include thickening of your hair, rapid growth, and changes in texture. Some women also have hair loss during or after pregnancy, or hair that feels dry or thin. Your hair will most likely return to normal after your baby is born. WHAT TO EXPECT AT YOUR PRENATAL VISITS During a routine prenatal visit:  You will be weighed to make sure you and the baby are growing normally.  Your blood pressure will be taken.  Your abdomen will be measured to track your baby's growth.  The fetal heartbeat will be listened to starting around week 10 or 12 of your pregnancy.  Test results from any previous visits will be discussed. Your health care provider may ask you:  How you are feeling.  If you are feeling the baby move.  If you have had any abnormal symptoms, such as leaking fluid, bleeding, severe headaches, or abdominal cramping.  If you are using any tobacco products,   including cigarettes, chewing tobacco, and electronic cigarettes.  If you have any questions. Other tests that may be performed during your first trimester include:  Blood tests to find your blood type and to check for the presence of any previous infections. They will also be used to check for low iron levels (anemia) and Rh antibodies. Later in the pregnancy, blood tests for diabetes will be done along with other tests if problems develop.  Urine tests to check for infections, diabetes, or protein in the urine.  An ultrasound to confirm the proper growth  and development of the baby.  An amniocentesis to check for possible genetic problems.  Fetal screens for spina bifida and Down syndrome.  You may need other tests to make sure you and the baby are doing well.  HIV (human immunodeficiency virus) testing. Routine prenatal testing includes screening for HIV, unless you choose not to have this test. HOME CARE INSTRUCTIONS  Medicines  Follow your health care provider's instructions regarding medicine use. Specific medicines may be either safe or unsafe to take during pregnancy.  Take your prenatal vitamins as directed.  If you develop constipation, try taking a stool softener if your health care provider approves. Diet  Eat regular, well-balanced meals. Choose a variety of foods, such as meat or vegetable-based protein, fish, milk and low-fat dairy products, vegetables, fruits, and whole grain breads and cereals. Your health care provider will help you determine the amount of weight gain that is right for you.  Avoid raw meat and uncooked cheese. These carry germs that can cause birth defects in the baby.  Eating four or five small meals rather than three large meals a day may help relieve nausea and vomiting. If you start to feel nauseous, eating a few soda crackers can be helpful. Drinking liquids between meals instead of during meals also seems to help nausea and vomiting.  If you develop constipation, eat more high-fiber foods, such as fresh vegetables or fruit and whole grains. Drink enough fluids to keep your urine clear or pale yellow. Activity and Exercise  Exercise only as directed by your health care provider. Exercising will help you:  Control your weight.  Stay in shape.  Be prepared for labor and delivery.  Experiencing pain or cramping in the lower abdomen or low back is a good sign that you should stop exercising. Check with your health care provider before continuing normal exercises.  Try to avoid standing for long  periods of time. Move your legs often if you must stand in one place for a long time.  Avoid heavy lifting.  Wear low-heeled shoes, and practice good posture.  You may continue to have sex unless your health care provider directs you otherwise. Relief of Pain or Discomfort  Wear a good support bra for breast tenderness.   Take warm sitz baths to soothe any pain or discomfort caused by hemorrhoids. Use hemorrhoid cream if your health care provider approves.   Rest with your legs elevated if you have leg cramps or low back pain.  If you develop varicose veins in your legs, wear support hose. Elevate your feet for 15 minutes, 3-4 times a day. Limit salt in your diet. Prenatal Care  Schedule your prenatal visits by the twelfth week of pregnancy. They are usually scheduled monthly at first, then more often in the last 2 months before delivery.  Write down your questions. Take them to your prenatal visits.  Keep all your prenatal visits as directed by your   health care provider. Safety  Wear your seat belt at all times when driving.  Make a list of emergency phone numbers, including numbers for family, friends, the hospital, and police and fire departments. General Tips  Ask your health care provider for a referral to a local prenatal education class. Begin classes no later than at the beginning of month 6 of your pregnancy.  Ask for help if you have counseling or nutritional needs during pregnancy. Your health care provider can offer advice or refer you to specialists for help with various needs.  Do not use hot tubs, steam rooms, or saunas.  Do not douche or use tampons or scented sanitary pads.  Do not cross your legs for long periods of time.  Avoid cat litter boxes and soil used by cats. These carry germs that can cause birth defects in the baby and possibly loss of the fetus by miscarriage or stillbirth.  Avoid all smoking, herbs, alcohol, and medicines not prescribed by  your health care provider. Chemicals in these affect the formation and growth of the baby.  Do not use any tobacco products, including cigarettes, chewing tobacco, and electronic cigarettes. If you need help quitting, ask your health care provider. You may receive counseling support and other resources to help you quit.  Schedule a dentist appointment. At home, brush your teeth with a soft toothbrush and be gentle when you floss. SEEK MEDICAL CARE IF:   You have dizziness.  You have mild pelvic cramps, pelvic pressure, or nagging pain in the abdominal area.  You have persistent nausea, vomiting, or diarrhea.  You have a bad smelling vaginal discharge.  You have pain with urination.  You notice increased swelling in your face, hands, legs, or ankles. SEEK IMMEDIATE MEDICAL CARE IF:   You have a fever.  You are leaking fluid from your vagina.  You have spotting or bleeding from your vagina.  You have severe abdominal cramping or pain.  You have rapid weight gain or loss.  You vomit blood or material that looks like coffee grounds.  You are exposed to German measles and have never had them.  You are exposed to fifth disease or chickenpox.  You develop a severe headache.  You have shortness of breath.  You have any kind of trauma, such as from a fall or a car accident.   This information is not intended to replace advice given to you by your health care provider. Make sure you discuss any questions you have with your health care provider.   Document Released: 04/30/2001 Document Revised: 05/27/2014 Document Reviewed: 03/16/2013 Elsevier Interactive Patient Education 2016 [employer]  

## 2015-09-12 NOTE — Progress Notes (Signed)
   Subjective:    Sally Oliver is a 25 y.o. G1P0000 at [redacted]w[redacted]d (by clinic scan, 1 week different from LMP) being seen today for her first obstetrical visit.  No concerns.   Filed Vitals:   09/12/15 1500  BP: 107/71  Pulse: 81  Weight: 106 lb (48.081 kg)    HISTORY: OB History  Gravida Para Term Preterm AB SAB TAB Ectopic Multiple Living  1 0 0 0 0 0 0 0 0 0     # Outcome Date GA Lbr Len/2nd Weight Sex Delivery Anes PTL Lv  1 Current              Past Medical History  Diagnosis Date  . Chicken pox   . Migraine headache   . Scoliosis    Past Surgical History  Procedure Laterality Date  . Wisdom tooth extraction    . Breast lumpectomy  2011    left  . Breast biopsy  2014    left   Family History  Problem Relation Age of Onset  . Migraines Mother   . Heart disease Father   . Stroke Father   . Arthritis Father   . Healthy Maternal Grandmother   . Stroke Maternal Grandfather   . Diverticulitis Paternal Grandmother      Exam    Uterus:     Pelvic Exam:    Perineum: No Hemorrhoids, Normal Perineum   Vulva: normal   Vagina:  normal mucosa, normal discharge   Cervix: deferred   Adnexa: not evaluated   Bony Pelvis: average  System: Breast:  normal appearance, no masses or tenderness   Skin: normal coloration and turgor, no rashes   Neurologic: oriented, normal, negative   Extremities: normal strength, tone, and muscle mass, no deformities   HEENT PERRLA, extra ocular movement intact and sclera clear, anicteric   Mouth/Teeth mucous membranes moist, pharynx normal without lesions and dental hygiene good   Neck supple and no masses   Cardiovascular: regular rate and rhythm   Respiratory:  appears well, vitals normal, no respiratory distress, acyanotic, normal RR, chest clear, no wheezing, crepitations, rhonchi, normal symmetric air entry   Abdomen: soft, non-tender; bowel sounds normal; no masses,  no organomegaly   Urinary: urethral meatus normal       Assessment:    Pregnancy: G1P0000 Patient Active Problem List   Diagnosis Date Noted  . Supervision of normal pregnancy 09/12/2015  . Fibroadenoma of left breast 01/21/2013     Plan:   Initial labs drawn. Continue prenatal vitamins. Problem list reviewed and updated. Genetic Screening discussed First Screen and Integrated Screen: discussed, information given. Ultrasound discussed; fetal survey: to be ordered later. The nature of [facility] with multiple MDs and other Advanced Practice Providers was explained to patient; also emphasized that residents, students are part of our team. She qualifies for [facility] and she agreed to be part of the program. She was signed up todday. Follow up in 5 weeks (12 week [facility]).    Osborne Oman, MD 09/12/2015

## 2015-09-13 ENCOUNTER — Encounter: Payer: Self-pay | Admitting: Obstetrics & Gynecology

## 2015-09-13 DIAGNOSIS — Z6791 Unspecified blood type, Rh negative: Secondary | ICD-10-CM | POA: Insufficient documentation

## 2015-09-13 DIAGNOSIS — O26899 Other specified pregnancy related conditions, unspecified trimester: Secondary | ICD-10-CM

## 2015-09-13 LAB — PRENATAL PROFILE (SOLSTAS)
ANTIBODY SCREEN: NEGATIVE
BASOS ABS: 0 {cells}/uL (ref 0–200)
BASOS PCT: 0 %
EOS PCT: 1 %
Eosinophils Absolute: 77 cells/uL (ref 15–500)
HEMATOCRIT: 38.3 % (ref 35.0–45.0)
HIV 1&2 Ab, 4th Generation: NONREACTIVE
Hemoglobin: 12.4 g/dL (ref 11.7–15.5)
Hepatitis B Surface Ag: NEGATIVE
LYMPHS PCT: 23 %
Lymphs Abs: 1771 cells/uL (ref 850–3900)
MCH: 26.9 pg — ABNORMAL LOW (ref 27.0–33.0)
MCHC: 32.4 g/dL (ref 32.0–36.0)
MCV: 83.1 fL (ref 80.0–100.0)
MONOS PCT: 5 %
MPV: 9.9 fL (ref 7.5–12.5)
Monocytes Absolute: 385 cells/uL (ref 200–950)
NEUTROS PCT: 71 %
Neutro Abs: 5467 cells/uL (ref 1500–7800)
PLATELETS: 293 10*3/uL (ref 140–400)
RBC: 4.61 MIL/uL (ref 3.80–5.10)
RDW: 13.5 % (ref 11.0–15.0)
RH TYPE: NEGATIVE
Rubella: 2.88 Index — ABNORMAL HIGH (ref <0.90)
WBC: 7.7 10*3/uL (ref 3.8–10.8)

## 2015-09-13 LAB — GC/CHLAMYDIA PROBE AMP
CT Probe RNA: NOT DETECTED
GC PROBE AMP APTIMA: NOT DETECTED

## 2015-09-13 NOTE — Progress Notes (Deleted)
Bedside US shows single IUP measuring [redacted]w[redacted]d which is 7 days

## 2015-09-13 NOTE — Progress Notes (Signed)
Bedside US shows single IUP measuring [redacted]w[redacted]d which is 1w different from LMP - changed EDD accordingly. FHR 166 on [place].

## 2015-09-14 LAB — CULTURE, OB URINE
Colony Count: NO GROWTH
Organism ID, Bacteria: NO GROWTH

## 2015-09-18 LAB — CYSTIC FIBROSIS DIAGNOSTIC STUDY

## 2015-09-26 ENCOUNTER — Telehealth: Payer: Self-pay | Admitting: *Deleted

## 2015-09-26 NOTE — Telephone Encounter (Signed)
-----   Message from Francia Greaves sent at 09/26/2015 11:16 AM EDT ----- Regarding: Advise Contact: [phone] Having really bad itching on feet, has tried benadryl, hot showers, Wants to know what else she can do

## 2015-09-26 NOTE — Telephone Encounter (Signed)
Pt c/o itching on her feet, has tried Benadryl and hot soaks and itching still continued.  Pt denies any swelling in the feet or rash.  Informed pt that she could apply Lotrimin anti fungal cream and see if that would help.  Pt will call back if symptoms persist or change.

## 2015-10-06 ENCOUNTER — Ambulatory Visit (INDEPENDENT_AMBULATORY_CARE_PROVIDER_SITE_OTHER): Payer: PRIVATE HEALTH INSURANCE | Admitting: Obstetrics & Gynecology

## 2015-10-06 VITALS — BP 107/70 | HR 80 | Wt 103.0 lb

## 2015-10-06 DIAGNOSIS — Z36 Encounter for antenatal screening of mother: Secondary | ICD-10-CM

## 2015-10-06 DIAGNOSIS — Z3491 Encounter for supervision of normal pregnancy, unspecified, first trimester: Secondary | ICD-10-CM

## 2015-10-06 DIAGNOSIS — O360110 Maternal care for anti-D [Rh] antibodies, first trimester, not applicable or unspecified: Secondary | ICD-10-CM

## 2015-10-06 DIAGNOSIS — Z3401 Encounter for supervision of normal first pregnancy, first trimester: Secondary | ICD-10-CM

## 2015-10-06 NOTE — Progress Notes (Signed)
C/O severe itching on hands and feet. No relief with lotrimin cream and benadryl. Worse with hot water. Ice helps relieve some Sx but does not stop it completely.

## 2015-10-06 NOTE — Progress Notes (Signed)
Subjective:  Timmesha Bloor is a 25 y.o. MW G1P0000 at [redacted]w[redacted]d being seen today for ongoing prenatal care.  She is currently monitored for the following issues for this low-risk pregnancy and has Fibroadenoma of left breast; Supervision of normal pregnancy; and Rh negative, antepartum on her problem list.  Patient reports hard and feet itching. She has tried benadryl and lotramin cream with no relief. .  Contractions: Not present. Vag. Bleeding: None.  Movement: Absent. Denies leaking of fluid.   The following portions of the patient's history were reviewed and updated as appropriate: allergies, current medications, past family history, past medical history, past social history, past surgical history and problem list. Problem list updated.  Objective:   Filed Vitals:   10/06/15 0942  BP: 107/70  Pulse: 80  Weight: 103 lb (46.72 kg)    Fetal Status: Fetal Heart Rate (bpm): 162   Movement: Absent     General:  Alert, oriented and cooperative. Patient is in no acute distress.  Skin: Skin is warm and dry. No rash noted.   Cardiovascular: Normal heart rate noted  Respiratory: Normal respiratory effort, no problems with respiration noted  Abdomen: Soft, gravid, appropriate for gestational age. Pain/Pressure: Absent     Pelvic: Vag. Bleeding: None Vag D/C Character: Thin   Cervical exam deferred        Extremities: Normal range of motion.  Edema: Trace  Mental Status: Normal mood and affect. Normal behavior. Normal judgment and thought content.   Urinalysis:      Assessment and Plan:  Pregnancy: G1P0000 at [redacted]w[redacted]d  1. Normal first pregnancy confirmed, first trimester  - Bile acids, total  2. Supervision of normal pregnancy, first trimester - Continue with [employer] [facility]  3. Rh negative, antepartum, first trimester, not applicable or unspecified fetus - Rhophylac at 28 weeks  Preterm labor symptoms and general obstetric precautions including but not limited to vaginal bleeding,  contractions, leaking of fluid and fetal movement were reviewed in detail with the patient. Please refer to After Visit Summary for other counseling recommendations.  Return for keep appt.   Emily Filbert, MD

## 2015-10-07 LAB — BILE ACIDS, TOTAL: BILE ACIDS TOTAL: 6 umol/L (ref 0–19)

## 2015-10-18 ENCOUNTER — Ambulatory Visit (INDEPENDENT_AMBULATORY_CARE_PROVIDER_SITE_OTHER): Payer: PRIVATE HEALTH INSURANCE | Admitting: Obstetrics & Gynecology

## 2015-10-18 VITALS — BP 101/69 | HR 76 | Wt 104.6 lb

## 2015-10-18 DIAGNOSIS — Z3491 Encounter for supervision of normal pregnancy, unspecified, first trimester: Secondary | ICD-10-CM

## 2015-10-18 DIAGNOSIS — Z3401 Encounter for supervision of normal first pregnancy, first trimester: Secondary | ICD-10-CM

## 2015-10-18 NOTE — Progress Notes (Signed)
Subjective:  Sally Oliver is a 25 y.o. MW G1P0000 at [redacted]w[redacted]d being seen today for ongoing prenatal care.  She is currently monitored for the following issues for this low-risk pregnancy and has Fibroadenoma of left breast; Supervision of normal pregnancy; and Rh negative, antepartum on her problem list.  Patient reports no complaints except still itching. She declines steroids. Contractions: Not present. Vag. Bleeding: None.  Movement: Absent. Denies leaking of fluid.   The following portions of the patient's history were reviewed and updated as appropriate: allergies, current medications, past family history, past medical history, past social history, past surgical history and problem list. Problem list updated.  Objective:   Filed Vitals:   10/18/15 0816  BP: 101/69  Pulse: 76  Weight: 104 lb 9.6 oz (47.446 kg)    Fetal Status: Fetal Heart Rate (bpm): 150   Movement: Absent     General:  Alert, oriented and cooperative. Patient is in no acute distress.  Skin: Skin is warm and dry. No rash noted.   Cardiovascular: Normal heart rate noted  Respiratory: Normal respiratory effort, no problems with respiration noted  Abdomen: Soft, gravid, appropriate for gestational age. Pain/Pressure: Absent     Pelvic: Vag. Bleeding: None Vag D/C Character: Thin   Cervical exam deferred        Extremities: Normal range of motion.     Mental Status: Normal mood and affect. Normal behavior. Normal judgment and thought content.   Urinalysis:      Assessment and Plan:  Pregnancy: G1P0000 at [redacted]w[redacted]d  1. Supervision of normal pregnancy, first trimester  - [place] MFM OB COMP + 14 WK; Future  Preterm labor symptoms and general obstetric precautions including but not limited to vaginal bleeding, contractions, leaking of fluid and fetal movement were reviewed in detail with the patient. Please refer to After Visit Summary for other counseling recommendations.  Return in about 8 weeks (around  12/13/2015).   Emily Filbert, MD

## 2015-10-26 ENCOUNTER — Encounter: Payer: PRIVATE HEALTH INSURANCE | Admitting: Obstetrics & Gynecology

## 2015-11-15 ENCOUNTER — Encounter: Payer: Self-pay | Admitting: Family

## 2015-11-15 ENCOUNTER — Ambulatory Visit (INDEPENDENT_AMBULATORY_CARE_PROVIDER_SITE_OTHER): Payer: 59 | Admitting: Family

## 2015-11-15 VITALS — BP 110/80 | HR 75 | Temp 97.4°F | Resp 16 | Ht 61.0 in | Wt 109.0 lb

## 2015-11-15 DIAGNOSIS — Z0000 Encounter for general adult medical examination without abnormal findings: Secondary | ICD-10-CM | POA: Diagnosis not present

## 2015-11-15 NOTE — Patient Instructions (Addendum)
Thank you for choosing [facility].  Summary/Instructions:  Congratulations!   Continue everything you are doing as you are doing exceptionally well.   Health Maintenance, Female Adopting a healthy lifestyle and getting preventive care can go a long way to promote health and wellness. Talk with your health care provider about what schedule of regular examinations is right for you. This is a good chance for you to check in with your provider about disease prevention and staying healthy. In between checkups, there are plenty of things you can do on your own. Experts have done a lot of research about which lifestyle changes and preventive measures are most likely to keep you healthy. Ask your health care provider for more information. WEIGHT AND DIET  Eat a healthy diet  Be sure to include plenty of vegetables, fruits, low-fat dairy products, and lean protein.  Do not eat a lot of foods high in solid fats, added sugars, or salt.  Get regular exercise. This is one of the most important things you can do for your health.  Most adults should exercise for at least 150 minutes each week. The exercise should increase your heart rate and make you sweat (moderate-intensity exercise).  Most adults should also do strengthening exercises at least twice a week. This is in addition to the moderate-intensity exercise.  Maintain a healthy weight  Body mass index (BMI) is a measurement that can be used to identify possible weight problems. It estimates body fat based on height and weight. Your health care provider can help determine your BMI and help you achieve or maintain a healthy weight.  For females 11 years of age and older:   A BMI below 18.5 is considered underweight.  A BMI of 18.5 to 24.9 is normal.  A BMI of 25 to 29.9 is considered overweight.  A BMI of 30 and above is considered obese.  Watch levels of cholesterol and blood lipids  You should start having your blood tested  for lipids and cholesterol at 27 years of age, then have this test every 5 years.  You may need to have your cholesterol levels checked more often if:  Your lipid or cholesterol levels are high.  You are older than 27 years of age.  You are at high risk for heart disease.  CANCER SCREENING   Lung Cancer  Lung cancer screening is recommended for adults 41-46 years old who are at high risk for lung cancer because of a history of smoking.  A yearly low-dose CT scan of the lungs is recommended for people who:  Currently smoke.  Have quit within the past 15 years.  Have at least a 30-pack-year history of smoking. A pack year is smoking an average of one pack of cigarettes a day for 1 year.  Yearly screening should continue until it has been 15 years since you quit.  Yearly screening should stop if you develop a health problem that would prevent you from having lung cancer treatment.  Breast Cancer  Practice breast self-awareness. This means understanding how your breasts normally appear and feel.  It also means doing regular breast self-exams. Let your health care provider know about any changes, no matter how small.  If you are in your 20s or 30s, you should have a clinical breast exam (CBE) by a health care provider every 1-3 years as part of a regular health exam.  If you are 17 or older, have a CBE every year. Also consider having a breast X-ray (  mammogram) every year.  If you have a family history of breast cancer, talk to your health care provider about genetic screening.  If you are at high risk for breast cancer, talk to your health care provider about having an MRI and a mammogram every year.  Breast cancer gene (BRCA) assessment is recommended for women who have family members with BRCA-related cancers. BRCA-related cancers include:  Breast.  Ovarian.  Tubal.  Peritoneal cancers.  Results of the assessment will determine the need for genetic counseling and  BRCA1 and BRCA2 testing. Cervical Cancer Your health care provider may recommend that you be screened regularly for cancer of the pelvic organs (ovaries, uterus, and vagina). This screening involves a pelvic examination, including checking for microscopic changes to the surface of your cervix (Pap test). You may be encouraged to have this screening done every 3 years, beginning at age 42.  For women ages 84-65, health care providers may recommend pelvic exams and Pap testing every 3 years, or they may recommend the Pap and pelvic exam, combined with testing for human papilloma virus (HPV), every 5 years. Some types of HPV increase your risk of cervical cancer. Testing for HPV may also be done on women of any age with unclear Pap test results.  Other health care providers may not recommend any screening for nonpregnant women who are considered low risk for pelvic cancer and who do not have symptoms. Ask your health care provider if a screening pelvic exam is right for you.  If you have had past treatment for cervical cancer or a condition that could lead to cancer, you need Pap tests and screening for cancer for at least 20 years after your treatment. If Pap tests have been discontinued, your risk factors (such as having a new sexual partner) need to be reassessed to determine if screening should resume. Some women have medical problems that increase the chance of getting cervical cancer. In these cases, your health care provider may recommend more frequent screening and Pap tests. Colorectal Cancer  This type of cancer can be detected and often prevented.  Routine colorectal cancer screening usually begins at 27 years of age and continues through 27 years of age.  Your health care provider may recommend screening at an earlier age if you have risk factors for colon cancer.  Your health care provider may also recommend using home test kits to check for hidden blood in the stool.  A small camera at  the end of a tube can be used to examine your colon directly (sigmoidoscopy or colonoscopy). This is done to check for the earliest forms of colorectal cancer.  Routine screening usually begins at age 35.  Direct examination of the colon should be repeated every 5-10 years through 27 years of age. However, you may need to be screened more often if early forms of precancerous polyps or small growths are found. Skin Cancer  Check your skin from head to toe regularly.  Tell your health care provider about any new moles or changes in moles, especially if there is a change in a mole's shape or color.  Also tell your health care provider if you have a mole that is larger than the size of a pencil eraser.  Always use sunscreen. Apply sunscreen liberally and repeatedly throughout the day.  Protect yourself by wearing long sleeves, pants, a wide-brimmed hat, and sunglasses whenever you are outside. HEART DISEASE, DIABETES, AND HIGH BLOOD PRESSURE   High blood pressure causes heart disease  and increases the risk of stroke. High blood pressure is more likely to develop in:  People who have blood pressure in the high end of the normal range (130-139/85-89 mm Hg).  People who are overweight or obese.  People who are African American.  If you are 45-61 years of age, have your blood pressure checked every 3-5 years. If you are 71 years of age or older, have your blood pressure checked every year. You should have your blood pressure measured twice--once when you are at a hospital or clinic, and once when you are not at a hospital or clinic. Record the average of the two measurements. To check your blood pressure when you are not at a hospital or clinic, you can use:  An automated blood pressure machine at a pharmacy.  A home blood pressure monitor.  If you are between 64 years and 54 years old, ask your health care provider if you should take aspirin to prevent strokes.  Have regular diabetes  screenings. This involves taking a blood sample to check your fasting blood sugar level.  If you are at a normal weight and have a low risk for diabetes, have this test once every three years after 27 years of age.  If you are overweight and have a high risk for diabetes, consider being tested at a younger age or more often. PREVENTING INFECTION  Hepatitis B  If you have a higher risk for hepatitis B, you should be screened for this virus. You are considered at high risk for hepatitis B if:  You were born in a country where hepatitis B is common. Ask your health care provider which countries are considered high risk.  Your parents were born in a high-risk country, and you have not been immunized against hepatitis B (hepatitis B vaccine).  You have HIV or AIDS.  You use needles to inject street drugs.  You live with someone who has hepatitis B.  You have had sex with someone who has hepatitis B.  You get hemodialysis treatment.  You take certain medicines for conditions, including cancer, organ transplantation, and autoimmune conditions. Hepatitis C  Blood testing is recommended for:  Everyone born from [birth year] through 1967.  Anyone with known risk factors for hepatitis C. Sexually transmitted infections (STIs)  You should be screened for sexually transmitted infections (STIs) including gonorrhea and chlamydia if:  You are sexually active and are younger than 27 years of age.  You are older than 27 years of age and your health care provider tells you that you are at risk for this type of infection.  Your sexual activity has changed since you were last screened and you are at an increased risk for chlamydia or gonorrhea. Ask your health care provider if you are at risk.  If you do not have HIV, but are at risk, it may be recommended that you take a prescription medicine daily to prevent HIV infection. This is called pre-exposure prophylaxis (PrEP). You are considered at risk  if:  You are sexually active and do not regularly use condoms or know the HIV status of your partner(s).  You take drugs by injection.  You are sexually active with a partner who has HIV. Talk with your health care provider about whether you are at high risk of being infected with HIV. If you choose to begin PrEP, you should first be tested for HIV. You should then be tested every 3 months for as long as you are taking PrEP.  PREGNANCY   If you are premenopausal and you may become pregnant, ask your health care provider about preconception counseling.  If you may become pregnant, take 400 to 800 micrograms (mcg) of folic acid every day.  If you want to prevent pregnancy, talk to your health care provider about birth control (contraception). OSTEOPOROSIS AND MENOPAUSE   Osteoporosis is a disease in which the bones lose minerals and strength with aging. This can result in serious bone fractures. Your risk for osteoporosis can be identified using a bone density scan.  If you are 80 years of age or older, or if you are at risk for osteoporosis and fractures, ask your health care provider if you should be screened.  Ask your health care provider whether you should take a calcium or vitamin D supplement to lower your risk for osteoporosis.  Menopause may have certain physical symptoms and risks.  Hormone replacement therapy may reduce some of these symptoms and risks. Talk to your health care provider about whether hormone replacement therapy is right for you.  HOME CARE INSTRUCTIONS   Schedule regular health, dental, and eye exams.  Stay current with your immunizations.   Do not use any tobacco products including cigarettes, chewing tobacco, or electronic cigarettes.  If you are pregnant, do not drink alcohol.  If you are breastfeeding, limit how much and how often you drink alcohol.  Limit alcohol intake to no more than 1 drink per day for nonpregnant women. One drink equals 12  ounces of beer, 5 ounces of wine, or 1 ounces of hard liquor.  Do not use street drugs.  Do not share needles.  Ask your health care provider for help if you need support or information about quitting drugs.  Tell your health care provider if you often feel depressed.  Tell your health care provider if you have ever been abused or do not feel safe at home.   This information is not intended to replace advice given to you by your health care provider. Make sure you discuss any questions you have with your health care provider.   Document Released: 11/19/2010 Document Revised: 05/27/2014 Document Reviewed: 04/07/2013 Elsevier Interactive Patient Education [employer].

## 2015-11-15 NOTE — Progress Notes (Signed)
Subjective:    Patient ID: Sally Oliver, female    DOB: Sep 26, 1990, 25 y.o.   MRN: [id number]  Chief Complaint  Patient presents with  . CPE    fasting    HPI:  Sally Oliver is a 25 y.o. female who presents today for an annual wellness visit.   1) Health Maintenance -   Diet - Averages about 5-6 meals per day consisting of fruits, vegetables, chicken, and beef. No caffeine intake currently.  Exercise - Walking the dog.    2) Preventative Exams / Immunizations:  Dental -- Up to date  Vision -- Up to date   Health Maintenance  Topic Date Due  . INFLUENZA VACCINE  12/19/2015  . TETANUS/TDAP  12/18/2016  . PAP SMEAR  01/25/2017  . HIV Screening  Completed     There is no immunization history on file for this patient.  No Known Allergies   Outpatient Prescriptions Prior to Visit  Medication Sig Dispense Refill  . cetirizine (ZYRTEC) 10 MG tablet Take 10 mg by mouth daily.    . prenatal vitamin w/FE, FA (NATACHEW) 29-1 MG CHEW chewable tablet Chew 1 tablet by mouth daily at 12 noon.     No facility-administered medications prior to visit.     Past Medical History  Diagnosis Date  . Chicken pox   . Migraine headache   . Scoliosis      Past Surgical History  Procedure Laterality Date  . Wisdom tooth extraction    . Breast lumpectomy  2011    left  . Breast biopsy  2014    left     Family History  Problem Relation Age of Onset  . Migraines Mother   . Heart disease Father   . Stroke Father   . Arthritis Father   . Healthy Maternal Grandmother   . Stroke Maternal Grandfather   . Diverticulitis Paternal Grandmother      Social History   Social History  . Marital Status: Married    Spouse Name: N/A  . Number of Children: 0  . Years of Education: 16   Occupational History  . [employer]     Social History Main Topics  . Smoking status: Never Smoker   . Smokeless tobacco: Never Used  . Alcohol Use: 0.0 oz/week    0 Standard  drinks or equivalent per week     Comment: social-occasional  . Drug Use: No  . Sexual Activity:    Partners: Male    Birth Control/ Protection: None   Other Topics Concern  . Not on file   Social History Narrative   Fun: Owns own business, walking the dogs.    Denies religious beliefs effecting health care.      Review of Systems  Constitutional: Denies fever, chills, fatigue, or significant weight gain/loss. HENT: Head: Denies headache or neck pain Ears: Denies changes in hearing, ringing in ears, earache, drainage Nose: Denies discharge, stuffiness, itching, nosebleed, sinus pain Throat: Denies sore throat, hoarseness, dry mouth, sores, thrush Eyes: Denies loss/changes in vision, pain, redness, blurry/double vision, flashing lights Cardiovascular: Denies chest pain/discomfort, tightness, palpitations, shortness of breath with activity, difficulty lying down, swelling, sudden awakening with shortness of breath Respiratory: Denies shortness of breath, cough, sputum production, wheezing Gastrointestinal: Denies dysphasia, heartburn, change in appetite, nausea, change in bowel habits, rectal bleeding, constipation, diarrhea, yellow skin or eyes Genitourinary: Denies frequency, urgency, burning/pain, blood in urine, incontinence, change in urinary strength. Musculoskeletal: Denies muscle/joint pain, stiffness, back pain, redness  or swelling of joints, trauma Skin: Denies rashes, lumps, itching, dryness, color changes, or hair/nail changes Neurological: Denies dizziness, fainting, seizures, weakness, numbness, tingling, tremor Psychiatric - Denies nervousness, stress, depression or memory loss Endocrine: Denies heat or cold intolerance, sweating, frequent urination, excessive thirst, changes in appetite Hematologic: Denies ease of bruising or bleeding     Objective:     BP 110/80 mmHg  Pulse 75  Temp(Src) 97.4 F (36.3 C) (Oral)  Resp 16  Ht 5\' 1"  (1.549 m)  Wt 109 lb  (49.442 kg)  BMI 20.61 kg/m2  SpO2 97%  LMP 07/17/2015 Nursing note and vital signs reviewed.  Physical Exam  Constitutional: She is oriented to person, place, and time. She appears well-developed and well-nourished.  HENT:  Head: Normocephalic.  Right Ear: Hearing, tympanic membrane, external ear and ear canal normal.  Left Ear: Hearing, tympanic membrane, external ear and ear canal normal.  Nose: Nose normal.  Mouth/Throat: Uvula is midline, oropharynx is clear and moist and mucous membranes are normal.  Eyes: Conjunctivae and EOM are normal. Pupils are equal, round, and reactive to light.  Neck: Neck supple. No JVD present. No tracheal deviation present. No thyromegaly present.  Cardiovascular: Normal rate, regular rhythm, normal heart sounds and intact distal pulses.   Pulmonary/Chest: Effort normal and breath sounds normal.  Abdominal: Soft. Bowel sounds are normal. She exhibits no distension and no mass. There is no tenderness. There is no rebound and no guarding.  Musculoskeletal: Normal range of motion. She exhibits no edema or tenderness.  Lymphadenopathy:    She has no cervical adenopathy.  Neurological: She is alert and oriented to person, place, and time. She has normal reflexes. No cranial nerve deficit. She exhibits normal muscle tone. Coordination normal.  Skin: Skin is warm and dry.  Psychiatric: She has a normal mood and affect. Her behavior is normal. Judgment and thought content normal.       Assessment & Plan:   Problem List Items Addressed This Visit      Other   Routine general medical examination at a health care facility - Primary    1) Anticipatory Guidance: Discussed importance of wearing a seatbelt while driving and not texting while driving; changing batteries in smoke detector at least once annually; wearing suntan lotion when outside; eating a balanced and moderate diet; getting physical activity at least 30 minutes per day.  2) Immunizations /  Screenings / Labs:  All immunizations are up to date per recommendations. All screenings are up to date per recommendations. Currently pregnant and blood work has been completed by obstetrics.   Overall well exam. She is [redacted] weeks pregnant and has had no complications to date. She is of good weight and exercises. Continue healthy lifestyle behaviors and choices. Follow up prevention exam in 1 year. Follow up office visit as needed.          I am having Ms. Boudreaux maintain her (prenatal vitamin w/FE, FA) and cetirizine.   Follow-up: Return if symptoms worsen or fail to improve.   Mauricio Po, FNP

## 2015-11-15 NOTE — Assessment & Plan Note (Signed)
1) Anticipatory Guidance: Discussed importance of wearing a seatbelt while driving and not texting while driving; changing batteries in smoke detector at least once annually; wearing suntan lotion when outside; eating a balanced and moderate diet; getting physical activity at least 30 minutes per day.  2) Immunizations / Screenings / Labs:  All immunizations are up to date per recommendations. All screenings are up to date per recommendations. Currently pregnant and blood work has been completed by obstetrics.   Overall well exam. She is [redacted] weeks pregnant and has had no complications to date. She is of good weight and exercises. Continue healthy lifestyle behaviors and choices. Follow up prevention exam in 1 year. Follow up office visit as needed.

## 2015-11-15 NOTE — Progress Notes (Signed)
Pre visit review using our clinic review tool, if applicable. No additional management support is needed unless otherwise documented below in the visit note. 

## 2015-11-27 ENCOUNTER — Encounter (HOSPITAL_COMMUNITY): Payer: Self-pay | Admitting: Obstetrics & Gynecology

## 2015-12-04 ENCOUNTER — Other Ambulatory Visit: Payer: Self-pay | Admitting: Obstetrics & Gynecology

## 2015-12-04 ENCOUNTER — Ambulatory Visit (HOSPITAL_COMMUNITY)
Admission: RE | Admit: 2015-12-04 | Discharge: 2015-12-04 | Disposition: A | Discharge status: Home / Self Care | Payer: 59 | Source: Ambulatory Visit | Attending: Obstetrics & Gynecology | Admitting: Obstetrics & Gynecology

## 2015-12-04 DIAGNOSIS — Z3A19 19 weeks gestation of pregnancy: Secondary | ICD-10-CM | POA: Diagnosis not present

## 2015-12-04 DIAGNOSIS — Z36 Encounter for antenatal screening of mother: Secondary | ICD-10-CM | POA: Insufficient documentation

## 2015-12-04 DIAGNOSIS — Z3689 Encounter for other specified antenatal screening: Secondary | ICD-10-CM

## 2015-12-04 DIAGNOSIS — Z3491 Encounter for supervision of normal pregnancy, unspecified, first trimester: Secondary | ICD-10-CM

## 2015-12-12 ENCOUNTER — Ambulatory Visit (INDEPENDENT_AMBULATORY_CARE_PROVIDER_SITE_OTHER): Payer: 59 | Admitting: Obstetrics and Gynecology

## 2015-12-12 VITALS — BP 109/73 | HR 90 | Wt 116.0 lb

## 2015-12-12 DIAGNOSIS — O360120 Maternal care for anti-D [Rh] antibodies, second trimester, not applicable or unspecified: Secondary | ICD-10-CM

## 2015-12-12 DIAGNOSIS — Z3482 Encounter for supervision of other normal pregnancy, second trimester: Secondary | ICD-10-CM

## 2015-12-12 DIAGNOSIS — Z3492 Encounter for supervision of normal pregnancy, unspecified, second trimester: Secondary | ICD-10-CM

## 2015-12-12 NOTE — Progress Notes (Signed)
Subjective:  Sally Oliver is a 25 y.o. G1P0000 at [redacted]w[redacted]d being seen today for ongoing prenatal care.  She is currently monitored for the following issues for this low-risk pregnancy and has Fibroadenoma of left breast; Supervision of normal pregnancy; Rh negative, antepartum; and Routine general medical examination at a health care facility on her problem list.  Patient reports no complaints.  Contractions: Not present. Vag. Bleeding: None.  Movement: Present. Denies leaking of fluid.   The following portions of the patient's history were reviewed and updated as appropriate: allergies, current medications, past family history, past medical history, past social history, past surgical history and problem list. Problem list updated.  Objective:   Vitals:   12/12/15 0825  BP: 109/73  Pulse: 90  Weight: 116 lb (52.6 kg)    Fetal Status: Fetal Heart Rate (bpm): 147 Fundal Height: 20 cm Movement: Present     General:  Alert, oriented and cooperative. Patient is in no acute distress.  Skin: Skin is warm and dry. No rash noted.   Cardiovascular: Normal heart rate noted  Respiratory: Normal respiratory effort, no problems with respiration noted  Abdomen: Soft, gravid, appropriate for gestational age. Pain/Pressure: Absent     Pelvic:  Cervical exam deferred        Extremities: Normal range of motion.  Edema: Trace  Mental Status: Normal mood and affect. Normal behavior. Normal judgment and thought content.   Urinalysis: Urine Protein: Negative Urine Glucose: Negative  Assessment and Plan:  Pregnancy: G1P0000 at [redacted]w[redacted]d  1. Supervision of normal pregnancy, second trimester Patient is doing well without complaints Patient is doing well with baby scripts 1 hr glucola, tdap and 3rd trimester labs next visit  2. Rh negative, antepartum, second trimester, not applicable or unspecified fetus Rhogam at next visit  General obstetric precautions including but not limited to vaginal bleeding,  contractions, leaking of fluid and fetal movement were reviewed in detail with the patient. Please refer to After Visit Summary for other counseling recommendations.  Return in about 8 weeks (around 02/06/2016).   Mora Bellman, MD

## 2016-01-30 ENCOUNTER — Encounter: Payer: Self-pay | Admitting: Family Medicine

## 2016-02-06 ENCOUNTER — Ambulatory Visit (INDEPENDENT_AMBULATORY_CARE_PROVIDER_SITE_OTHER): Payer: 59 | Admitting: Family Medicine

## 2016-02-06 VITALS — BP 118/75 | HR 108 | Wt 123.0 lb

## 2016-02-06 DIAGNOSIS — Z23 Encounter for immunization: Secondary | ICD-10-CM

## 2016-02-06 DIAGNOSIS — O360130 Maternal care for anti-D [Rh] antibodies, third trimester, not applicable or unspecified: Secondary | ICD-10-CM | POA: Diagnosis not present

## 2016-02-06 DIAGNOSIS — Z3492 Encounter for supervision of normal pregnancy, unspecified, second trimester: Secondary | ICD-10-CM | POA: Diagnosis not present

## 2016-02-06 LAB — CBC
HEMATOCRIT: 33.7 % — AB (ref 35.0–45.0)
HEMOGLOBIN: 11 g/dL — AB (ref 11.7–15.5)
MCH: 27.4 pg (ref 27.0–33.0)
MCHC: 32.6 g/dL (ref 32.0–36.0)
MCV: 84 fL (ref 80.0–100.0)
MPV: 9.4 fL (ref 7.5–12.5)
Platelets: 258 10*3/uL (ref 140–400)
RBC: 4.01 MIL/uL (ref 3.80–5.10)
RDW: 13.5 % (ref 11.0–15.0)
WBC: 10.1 10*3/uL (ref 3.8–10.8)

## 2016-02-06 MED ORDER — RHO D IMMUNE GLOBULIN 1500 UNITS IM SOSY
1500.0000 [IU] | PREFILLED_SYRINGE | Freq: Once | INTRAMUSCULAR | Status: AC
  Administered 2016-02-06: 1500 [IU] via INTRAMUSCULAR

## 2016-02-06 NOTE — Progress Notes (Signed)
   PRENATAL VISIT NOTE  Subjective:  Sally Oliver is a 25 y.o. G1P0000 at [redacted]w[redacted]d being seen today for ongoing prenatal care.  She is currently monitored for the following issues for this low-risk pregnancy and has Fibroadenoma of left breast; Supervision of normal pregnancy; and Rh negative, antepartum on her problem list.  Patient reports no complaints.  Contractions: Not present. Vag. Bleeding: None.  Movement: Present. Denies leaking of fluid.   The following portions of the patient's history were reviewed and updated as appropriate: allergies, current medications, past family history, past medical history, past social history, past surgical history and problem list. Problem list updated.  Objective:   Vitals:   02/06/16 0847  BP: 118/75  Pulse: (!) 108  Weight: 123 lb (55.8 kg)    Fetal Status: Fetal Heart Rate (bpm): 148 Fundal Height: 28 cm Movement: Present     General:  Alert, oriented and cooperative. Patient is in no acute distress.  Skin: Skin is warm and dry. No rash noted.   Cardiovascular: Normal heart rate noted  Respiratory: Normal respiratory effort, no problems with respiration noted  Abdomen: Soft, gravid, appropriate for gestational age. Pain/Pressure: Absent     Pelvic:  Cervical exam deferred        Extremities: Normal range of motion.  Edema: Trace  Mental Status: Normal mood and affect. Normal behavior. Normal judgment and thought content.   Urinalysis: Urine Protein: Negative Urine Glucose: Negative  Assessment and Plan:  Pregnancy: G1P0000 at [redacted]w[redacted]d  1. Supervision of normal pregnancy, second trimester Continue routine prenatal care. Baby Rx 28 wk labs and flu, TDaP and Rhogam today - Glucose Tolerance, 1 HR (50g) - CBC - RPR - HIV antibody - Tdap vaccine greater than or equal to 7yo IM - Antibody screen - Flu Vaccine QUAD 36+ mos IM  2. Rh negative, antepartum, third trimester, not applicable or unspecified fetus - Rho D Immune Globulin  SOSY 1,500 Units; Inject 1,500 Units into the muscle once.  Preterm labor symptoms and general obstetric precautions including but not limited to vaginal bleeding, contractions, leaking of fluid and fetal movement were reviewed in detail with the patient. Please refer to After Visit Summary for other counseling recommendations.  Return in 4 weeks (on 03/05/2016).  Donnamae Jude, MD

## 2016-02-06 NOTE — Patient Instructions (Addendum)
[facility], [place], [place] [address] Get Directions   View Phone   Web Site   More (7)    [facility] [address], [place], [place] [address] Get Directions   View Phone   More (3)  [place], [place], [place] [address] Get Directions   View Phone   More (2)  [facility] [address], [place], [place] [address] Get Directions   View Phone   More (2)  [place], [place], [place] [address] Get Directions   View Phone   Yellow Pages Ad   More (2)  [facility] [address], [place], [place] [address] Get Directions   View Phone   Web Site   Yellow Pages Ad   More (4)  (1)  Ninfa Linden MD  View Phone   More [facility] [address] [place], [place] [address] Get Directions   View Phone   More (6)  Derry Skill MD [address] [place], [place] [address] Get Directions   View Phone   More  Thomes Cake MD [address], [place], [place] [address] Get Directions   View Phone   More [facility] [place], [place], [place] [address] Get Directions   View Phone   More [facility] [address], [place], [place] [address] Get Directions   View Phone   More [facility] [place], [place], [place] [address] Get Directions   View Phone   More [phone]  [facility] [place], [place], [place] [address] Get Directions   View Phone   More (4)  [place], [place] [address] [place], [place], [place] [address] Get Directions   View Phone   More (2)  [place] [facility] [address], [place], [place] [address] Get Directions   View Phone   More [facility] [place], [place], [place] [address] Get Directions    Contraception Choices Contraception (birth control) is  the use of any methods or devices to prevent pregnancy. Below are some methods to help avoid pregnancy. HORMONAL METHODS   Contraceptive implant. This is a thin, plastic tube containing progesterone hormone. It does not contain estrogen hormone. Your health care provider inserts the tube in the inner part of the upper arm. The tube can remain in place for up to 3 years. After 3 years, the implant must be removed. The implant prevents the ovaries from releasing an egg (ovulation), thickens the cervical mucus to prevent sperm from entering the uterus, and thins the lining of the inside of the uterus.  Progesterone-only injections. These injections are given every 3 months by your health care provider to prevent pregnancy. This synthetic progesterone hormone stops the ovaries from releasing eggs. It also thickens cervical mucus and changes the uterine lining. This makes it harder for sperm to survive in the uterus.  Birth control pills. These pills contain estrogen and progesterone hormone. They work by preventing the ovaries from releasing eggs (ovulation). They also cause the cervical mucus to thicken, preventing the sperm from entering the uterus. Birth control pills are prescribed by a health care provider.Birth control pills can also be used to treat heavy periods.  Minipill. This type of birth control pill contains only the progesterone hormone. They are taken every day of each month and must be prescribed by your  health care provider.  Birth control patch. The patch contains hormones similar to those in birth control pills. It must be changed once a week and is prescribed by a health care provider.  Vaginal ring. The ring contains hormones similar to those in birth control pills. It is left in the vagina for 3 weeks, removed for 1 week, and then a new one is put back in place. The patient must be comfortable inserting and removing the ring from the vagina.A health care provider's prescription is  necessary.  Emergency contraception. Emergency contraceptives prevent pregnancy after unprotected sexual intercourse. This pill can be taken right after sex or up to 5 days after unprotected sex. It is most effective the sooner you take the pills after having sexual intercourse. Most emergency contraceptive pills are available without a prescription. Check with your pharmacist. Do not use emergency contraception as your only form of birth control. BARRIER METHODS   Female condom. This is a thin sheath (latex or rubber) that is worn over the penis during sexual intercourse. It can be used with spermicide to increase effectiveness.  Female condom. This is a soft, loose-fitting sheath that is put into the vagina before sexual intercourse.  Diaphragm. This is a soft, latex, dome-shaped barrier that must be fitted by a health care provider. It is inserted into the vagina, along with a spermicidal jelly. It is inserted before intercourse. The diaphragm should be left in the vagina for 6 to 8 hours after intercourse.  Cervical cap. This is a round, soft, latex or plastic cup that fits over the cervix and must be fitted by a health care provider. The cap can be left in place for up to 48 hours after intercourse.  Sponge. This is a soft, circular piece of polyurethane foam. The sponge has spermicide in it. It is inserted into the vagina after wetting it and before sexual intercourse.  Spermicides. These are chemicals that kill or block sperm from entering the cervix and uterus. They come in the form of creams, jellies, suppositories, foam, or tablets. They do not require a prescription. They are inserted into the vagina with an applicator before having sexual intercourse. The process must be repeated every time you have sexual intercourse. INTRAUTERINE CONTRACEPTION  Intrauterine device (IUD). This is a T-shaped device that is put in a woman's uterus during a menstrual period to prevent pregnancy. There are 2  types:  Copper IUD. This type of IUD is wrapped in copper wire and is placed inside the uterus. Copper makes the uterus and fallopian tubes produce a fluid that kills sperm. It can stay in place for 10 years.  Hormone IUD. This type of IUD contains the hormone progestin (synthetic progesterone). The hormone thickens the cervical mucus and prevents sperm from entering the uterus, and it also thins the uterine lining to prevent implantation of a fertilized egg. The hormone can weaken or kill the sperm that get into the uterus. It can stay in place for 3-5 years, depending on which type of IUD is used. PERMANENT METHODS OF CONTRACEPTION  Female tubal ligation. This is when the woman's fallopian tubes are surgically sealed, tied, or blocked to prevent the egg from traveling to the uterus.  Hysteroscopic sterilization. This involves placing a small coil or insert into each fallopian tube. Your doctor uses a technique called hysteroscopy to do the procedure. The device causes scar tissue to form. This results in permanent blockage of the fallopian tubes, so the sperm cannot fertilize the egg.  It takes about 3 months after the procedure for the tubes to become blocked. You must use another form of birth control for these 3 months.  Female sterilization. This is when the female has the tubes that carry sperm tied off (vasectomy).This blocks sperm from entering the vagina during sexual intercourse. After the procedure, the man can still ejaculate fluid (semen). NATURAL PLANNING METHODS  Natural family planning. This is not having sexual intercourse or using a barrier method (condom, diaphragm, cervical cap) on days the woman could become pregnant.  Calendar method. This is keeping track of the length of each menstrual cycle and identifying when you are fertile.  Ovulation method. This is avoiding sexual intercourse during ovulation.  Symptothermal method. This is avoiding sexual intercourse during ovulation,  using a thermometer and ovulation symptoms.  Post-ovulation method. This is timing sexual intercourse after you have ovulated. Regardless of which type or method of contraception you choose, it is important that you use condoms to protect against the transmission of sexually transmitted infections (STIs). Talk with your health care provider about which form of contraception is most appropriate for you.   This information is not intended to replace advice given to you by your health care provider. Make sure you discuss any questions you have with your health care provider.   Document Released: 05/06/2005 Document Revised: 05/11/2013 Document Reviewed: 10/29/2012 Elsevier Interactive Patient Education 2016 [place] of Pregnancy The third trimester is from week 29 through week 42, months 7 through 9. The third trimester is a time when the fetus is growing rapidly. At the end of the ninth month, the fetus is about 20 inches in length and weighs 6-10 pounds.  BODY CHANGES Your body goes through many changes during pregnancy. The changes vary from woman to woman.   Your weight will continue to increase. You can expect to gain 25-35 pounds (11-16 kg) by the end of the pregnancy.  You may begin to get stretch marks on your hips, abdomen, and breasts.  You may urinate more often because the fetus is moving lower into your pelvis and pressing on your bladder.  You may develop or continue to have heartburn as a result of your pregnancy.  You may develop constipation because certain hormones are causing the muscles that push waste through your intestines to slow down.  You may develop hemorrhoids or swollen, bulging veins (varicose veins).  You may have pelvic pain because of the weight gain and pregnancy hormones relaxing your joints between the bones in your pelvis. Backaches may result from overexertion of the muscles supporting your posture.  You may have changes in your  hair. These can include thickening of your hair, rapid growth, and changes in texture. Some women also have hair loss during or after pregnancy, or hair that feels dry or thin. Your hair will most likely return to normal after your baby is born.  Your breasts will continue to grow and be tender. A yellow discharge may leak from your breasts called colostrum.  Your belly button may stick out.  You may feel short of breath because of your expanding uterus.  You may notice the fetus "dropping," or moving lower in your abdomen.  You may have a bloody mucus discharge. This usually occurs a few days to a week before labor begins.  Your cervix becomes thin and soft (effaced) near your due date. WHAT TO EXPECT AT YOUR PRENATAL EXAMS  You will have prenatal exams every 2 weeks  until week 36. Then, you will have weekly prenatal exams. During a routine prenatal visit:  You will be weighed to make sure you and the fetus are growing normally.  Your blood pressure is taken.  Your abdomen will be measured to track your baby's growth.  The fetal heartbeat will be listened to.  Any test results from the previous visit will be discussed.  You may have a cervical check near your due date to see if you have effaced. At around 36 weeks, your caregiver will check your cervix. At the same time, your caregiver will also perform a test on the secretions of the vaginal tissue. This test is to determine if a type of bacteria, Group B streptococcus, is present. Your caregiver will explain this further. Your caregiver may ask you:  What your birth plan is.  How you are feeling.  If you are feeling the baby move.  If you have had any abnormal symptoms, such as leaking fluid, bleeding, severe headaches, or abdominal cramping.  If you are using any tobacco products, including cigarettes, chewing tobacco, and electronic cigarettes.  If you have any questions. Other tests or screenings that may be performed  during your third trimester include:  Blood tests that check for low iron levels (anemia).  Fetal testing to check the health, activity level, and growth of the fetus. Testing is done if you have certain medical conditions or if there are problems during the pregnancy.  HIV (human immunodeficiency virus) testing. If you are at high risk, you may be screened for HIV during your third trimester of pregnancy. FALSE LABOR You may feel small, irregular contractions that eventually go away. These are called Braxton Hicks contractions, or false labor. Contractions may last for hours, days, or even weeks before true labor sets in. If contractions come at regular intervals, intensify, or become painful, it is best to be seen by your caregiver.  SIGNS OF LABOR   Menstrual-like cramps.  Contractions that are 5 minutes apart or less.  Contractions that start on the top of the uterus and spread down to the lower abdomen and back.  A sense of increased pelvic pressure or back pain.  A watery or bloody mucus discharge that comes from the vagina. If you have any of these signs before the 37th week of pregnancy, call your caregiver right away. You need to go to the hospital to get checked immediately. HOME CARE INSTRUCTIONS   Avoid all smoking, herbs, alcohol, and unprescribed drugs. These chemicals affect the formation and growth of the baby.  Do not use any tobacco products, including cigarettes, chewing tobacco, and electronic cigarettes. If you need help quitting, ask your health care provider. You may receive counseling support and other resources to help you quit.  Follow your caregiver's instructions regarding medicine use. There are medicines that are either safe or unsafe to take during pregnancy.  Exercise only as directed by your caregiver. Experiencing uterine cramps is a good sign to stop exercising.  Continue to eat regular, healthy meals.  Wear a good support bra for breast  tenderness.  Do not use hot tubs, steam rooms, or saunas.  Wear your seat belt at all times when driving.  Avoid raw meat, uncooked cheese, cat litter boxes, and soil used by cats. These carry germs that can cause birth defects in the baby.  Take your prenatal vitamins.  Take 1500-2000 mg of calcium daily starting at the 20th week of pregnancy until you deliver your baby.  Try taking a stool softener (if your caregiver approves) if you develop constipation. Eat more high-fiber foods, such as fresh vegetables or fruit and whole grains. Drink plenty of fluids to keep your urine clear or pale yellow.  Take warm sitz baths to soothe any pain or discomfort caused by hemorrhoids. Use hemorrhoid cream if your caregiver approves.  If you develop varicose veins, wear support hose. Elevate your feet for 15 minutes, 3-4 times a day. Limit salt in your diet.  Avoid heavy lifting, wear low heal shoes, and practice good posture.  Rest a lot with your legs elevated if you have leg cramps or low back pain.  Visit your dentist if you have not gone during your pregnancy. Use a soft toothbrush to brush your teeth and be gentle when you floss.  A sexual relationship may be continued unless your caregiver directs you otherwise.  Do not travel far distances unless it is absolutely necessary and only with the approval of your caregiver.  Take prenatal classes to understand, practice, and ask questions about the labor and delivery.  Make a trial run to the hospital.  Pack your hospital bag.  Prepare the baby's nursery.  Continue to go to all your prenatal visits as directed by your caregiver. SEEK MEDICAL CARE IF:  You are unsure if you are in labor or if your water has broken.  You have dizziness.  You have mild pelvic cramps, pelvic pressure, or nagging pain in your abdominal area.  You have persistent nausea, vomiting, or diarrhea.  You have a bad smelling vaginal discharge.  You have  pain with urination. SEEK IMMEDIATE MEDICAL CARE IF:   You have a fever.  You are leaking fluid from your vagina.  You have spotting or bleeding from your vagina.  You have severe abdominal cramping or pain.  You have rapid weight loss or gain.  You have shortness of breath with chest pain.  You notice sudden or extreme swelling of your face, hands, ankles, feet, or legs.  You have not felt your baby move in over an hour.  You have severe headaches that do not go away with medicine.  You have vision changes.   This information is not intended to replace advice given to you by your health care provider. Make sure you discuss any questions you have with your health care provider.   Document Released: 04/30/2001 Document Revised: 05/27/2014 Document Reviewed: 07/07/2012 Elsevier Interactive Patient Education [employer].   Breastfeeding Deciding to breastfeed is one of the best choices you can make for you and your baby. A change in hormones during pregnancy causes your breast tissue to grow and increases the number and size of your milk ducts. These hormones also allow proteins, sugars, and fats from your blood supply to make breast milk in your milk-producing glands. Hormones prevent breast milk from being released before your baby is born as well as prompt milk flow after birth. Once breastfeeding has begun, thoughts of your baby, as well as his or her sucking or crying, can stimulate the release of milk from your milk-producing glands.  BENEFITS OF BREASTFEEDING For Your Baby  Your first milk (colostrum) helps your baby's digestive system function better.  There are antibodies in your milk that help your baby fight off infections.  Your baby has a lower incidence of asthma, allergies, and sudden infant death syndrome.  The nutrients in breast milk are better for your baby than infant formulas and are designed uniquely for  your baby's needs.  Breast milk improves your  baby's brain development.  Your baby is less likely to develop other conditions, such as childhood obesity, asthma, or type 2 diabetes mellitus. For You  Breastfeeding helps to create a very special bond between you and your baby.  Breastfeeding is convenient. Breast milk is always available at the correct temperature and costs nothing.  Breastfeeding helps to burn calories and helps you lose the weight gained during pregnancy.  Breastfeeding makes your uterus contract to its prepregnancy size faster and slows bleeding (lochia) after you give birth.   Breastfeeding helps to lower your risk of developing type 2 diabetes mellitus, osteoporosis, and breast or ovarian cancer later in life. SIGNS THAT YOUR BABY IS HUNGRY Early Signs of Hunger  Increased alertness or activity.  Stretching.  Movement of the head from side to side.  Movement of the head and opening of the mouth when the corner of the mouth or cheek is stroked (rooting).  Increased sucking sounds, smacking lips, cooing, sighing, or squeaking.  Hand-to-mouth movements.  Increased sucking of fingers or hands. Late Signs of Hunger  Fussing.  Intermittent crying. Extreme Signs of Hunger Signs of extreme hunger will require calming and consoling before your baby will be able to breastfeed successfully. Do not wait for the following signs of extreme hunger to occur before you initiate breastfeeding:  Restlessness.  A loud, strong cry.  Screaming. BREASTFEEDING BASICS Breastfeeding Initiation  Find a comfortable place to sit or lie down, with your neck and back well supported.  Place a pillow or rolled up blanket under your baby to bring him or her to the level of your breast (if you are seated). Nursing pillows are specially designed to help support your arms and your baby while you breastfeed.  Make sure that your baby's abdomen is facing your abdomen.  Gently massage your breast. With your fingertips, massage  from your chest wall toward your nipple in a circular motion. This encourages milk flow. You may need to continue this action during the feeding if your milk flows slowly.  Support your breast with 4 fingers underneath and your thumb above your nipple. Make sure your fingers are well away from your nipple and your baby's mouth.  Stroke your baby's lips gently with your finger or nipple.  When your baby's mouth is open wide enough, quickly bring your baby to your breast, placing your entire nipple and as much of the colored area around your nipple (areola) as possible into your baby's mouth.  More areola should be visible above your baby's upper lip than below the lower lip.  Your baby's tongue should be between his or her lower gum and your breast.  Ensure that your baby's mouth is correctly positioned around your nipple (latched). Your baby's lips should create a seal on your breast and be turned out (everted).  It is common for your baby to suck about 2-3 minutes in order to start the flow of breast milk. Latching Teaching your baby how to latch on to your breast properly is very important. An improper latch can cause nipple pain and decreased milk supply for you and poor weight gain in your baby. Also, if your baby is not latched onto your nipple properly, he or she may swallow some air during feeding. This can make your baby fussy. Burping your baby when you switch breasts during the feeding can help to get rid of the air. However, teaching your baby to latch on  properly is still the best way to prevent fussiness from swallowing air while breastfeeding. Signs that your baby has successfully latched on to your nipple:  Silent tugging or silent sucking, without causing you pain.  Swallowing heard between every 3-4 sucks.  Muscle movement above and in front of his or her ears while sucking. Signs that your baby has not successfully latched on to nipple:  Sucking sounds or smacking sounds  from your baby while breastfeeding.  Nipple pain. If you think your baby has not latched on correctly, slip your finger into the corner of your baby's mouth to break the suction and place it between your baby's gums. Attempt breastfeeding initiation again. Signs of Successful Breastfeeding Signs from your baby:  A gradual decrease in the number of sucks or complete cessation of sucking.  Falling asleep.  Relaxation of his or her body.  Retention of a small amount of milk in his or her mouth.  Letting go of your breast by himself or herself. Signs from you:  Breasts that have increased in firmness, weight, and size 1-3 hours after feeding.  Breasts that are softer immediately after breastfeeding.  Increased milk volume, as well as a change in milk consistency and color by the fifth day of breastfeeding.  Nipples that are not sore, cracked, or bleeding. Signs That Your [employer] is Getting Enough Milk  Wetting at least 3 diapers in a 24-hour period. The urine should be clear and pale yellow by age 78 days.  At least 3 stools in a 24-hour period by age 78 days. The stool should be soft and yellow.  At least 3 stools in a 24-hour period by age 52 days. The stool should be seedy and yellow.  No loss of weight greater than 10% of birth weight during the first 12 days of age.  Average weight gain of 4-7 ounces (113-198 g) per week after age 78 days.  Consistent daily weight gain by age 30 days, without weight loss after the age of 2 weeks. After a feeding, your baby may spit up a small amount. This is common. BREASTFEEDING FREQUENCY AND DURATION Frequent feeding will help you make more milk and can prevent sore nipples and breast engorgement. Breastfeed when you feel the need to reduce the fullness of your breasts or when your baby shows signs of hunger. This is called "breastfeeding on demand." Avoid introducing a pacifier to your baby while you are working to establish breastfeeding (the  first 4-6 weeks after your baby is born). After this time you may choose to use a pacifier. Research has shown that pacifier use during the first year of a baby's life decreases the risk of sudden infant death syndrome (SIDS). Allow your baby to feed on each breast as long as he or she wants. Breastfeed until your baby is finished feeding. When your baby unlatches or falls asleep while feeding from the first breast, offer the second breast. Because newborns are often sleepy in the first few weeks of life, you may need to awaken your baby to get him or her to feed. Breastfeeding times will vary from baby to baby. However, the following rules can serve as a guide to help you ensure that your baby is properly fed:  Newborns (babies 74 weeks of age or younger) may breastfeed every 1-3 hours.  Newborns should not go longer than 3 hours during the day or 5 hours during the night without breastfeeding.  You should breastfeed your baby a minimum of  8 times in a 24-hour period until you begin to introduce solid foods to your baby at around 13 months of age. BREAST MILK PUMPING Pumping and storing breast milk allows you to ensure that your baby is exclusively fed your breast milk, even at times when you are unable to breastfeed. This is especially important if you are going back to work while you are still breastfeeding or when you are not able to be present during feedings. Your lactation consultant can give you guidelines on how long it is safe to store breast milk. A breast pump is a machine that allows you to pump milk from your breast into a sterile bottle. The pumped breast milk can then be stored in a refrigerator or freezer. Some breast pumps are operated by hand, while others use electricity. Ask your lactation consultant which type will work best for you. Breast pumps can be purchased, but some hospitals and breastfeeding support groups lease breast pumps on a monthly basis. A lactation consultant can teach  you how to hand express breast milk, if you prefer not to use a pump. CARING FOR YOUR BREASTS WHILE YOU BREASTFEED Nipples can become dry, cracked, and sore while breastfeeding. The following recommendations can help keep your breasts moisturized and healthy:  Avoid using soap on your nipples.  Wear a supportive bra. Although not required, special nursing bras and tank tops are designed to allow access to your breasts for breastfeeding without taking off your entire bra or top. Avoid wearing underwire-style bras or extremely tight bras.  Air dry your nipples for 3-65minutes after each feeding.  Use only cotton bra pads to absorb leaked breast milk. Leaking of breast milk between feedings is normal.  Use lanolin on your nipples after breastfeeding. Lanolin helps to maintain your skin's normal moisture barrier. If you use pure lanolin, you do not need to wash it off before feeding your baby again. Pure lanolin is not toxic to your baby. You may also hand express a few drops of breast milk and gently massage that milk into your nipples and allow the milk to air dry. In the first few weeks after giving birth, some women experience extremely full breasts (engorgement). Engorgement can make your breasts feel heavy, warm, and tender to the touch. Engorgement peaks within 3-5 days after you give birth. The following recommendations can help ease engorgement:  Completely empty your breasts while breastfeeding or pumping. You may want to start by applying warm, moist heat (in the shower or with warm water-soaked hand towels) just before feeding or pumping. This increases circulation and helps the milk flow. If your baby does not completely empty your breasts while breastfeeding, pump any extra milk after he or she is finished.  Wear a snug bra (nursing or regular) or tank top for 1-2 days to signal your body to slightly decrease milk production.  Apply ice packs to your breasts, unless this is too  uncomfortable for you.  Make sure that your baby is latched on and positioned properly while breastfeeding. If engorgement persists after 48 hours of following these recommendations, contact your health care provider or a Advertising copywriter. OVERALL HEALTH CARE RECOMMENDATIONS WHILE BREASTFEEDING  Eat healthy foods. Alternate between meals and snacks, eating 3 of each per day. Because what you eat affects your breast milk, some of the foods may make your baby more irritable than usual. Avoid eating these foods if you are sure that they are negatively affecting your baby.  Drink milk, fruit juice,  and water to satisfy your thirst (about 10 glasses a day).  Rest often, relax, and continue to take your prenatal vitamins to prevent fatigue, stress, and anemia.  Continue breast self-awareness checks.  Avoid chewing and smoking tobacco. Chemicals from cigarettes that pass into breast milk and exposure to secondhand smoke may harm your baby.  Avoid alcohol and drug use, including marijuana. Some medicines that may be harmful to your baby can pass through breast milk. It is important to ask your health care provider before taking any medicine, including all over-the-counter and prescription medicine as well as vitamin and herbal supplements. It is possible to become pregnant while breastfeeding. If birth control is desired, ask your health care provider about options that will be safe for your baby. SEEK MEDICAL CARE IF:  You feel like you want to stop breastfeeding or have become frustrated with breastfeeding.  You have painful breasts or nipples.  Your nipples are cracked or bleeding.  Your breasts are red, tender, or warm.  You have a swollen area on either breast.  You have a fever or chills.  You have nausea or vomiting.  You have drainage other than breast milk from your nipples.  Your breasts do not become full before feedings by the fifth day after you give birth.  You feel  sad and depressed.  Your baby is too sleepy to eat well.  Your baby is having trouble sleeping.   Your baby is wetting less than 3 diapers in a 24-hour period.  Your baby has less than 3 stools in a 24-hour period.  Your baby's skin or the white part of his or her eyes becomes yellow.   Your baby is not gaining weight by 65 days of age. SEEK IMMEDIATE MEDICAL CARE IF:  Your baby is overly tired (lethargic) and does not want to wake up and feed.  Your baby develops an unexplained fever.   This information is not intended to replace advice given to you by your health care provider. Make sure you discuss any questions you have with your health care provider.   Document Released: 05/06/2005 Document Revised: 01/25/2015 Document Reviewed: 10/28/2012 Elsevier Interactive Patient Education [employer].

## 2016-02-07 LAB — RPR

## 2016-02-07 LAB — HIV ANTIBODY (ROUTINE TESTING W REFLEX): HIV: NONREACTIVE

## 2016-02-07 LAB — ANTIBODY SCREEN: ANTIBODY SCREEN: NEGATIVE

## 2016-02-07 LAB — GLUCOSE TOLERANCE, 1 HOUR (50G) W/O FASTING: GLUCOSE, 1 HR, GESTATIONAL: 134 mg/dL (ref <140)

## 2016-02-27 ENCOUNTER — Ambulatory Visit: Payer: 59

## 2016-02-29 ENCOUNTER — Telehealth: Payer: 59 | Admitting: Physician Assistant

## 2016-02-29 DIAGNOSIS — J069 Acute upper respiratory infection, unspecified: Secondary | ICD-10-CM

## 2016-02-29 DIAGNOSIS — Z3493 Encounter for supervision of normal pregnancy, unspecified, third trimester: Secondary | ICD-10-CM

## 2016-02-29 DIAGNOSIS — B9789 Other viral agents as the cause of diseases classified elsewhere: Principal | ICD-10-CM

## 2016-02-29 NOTE — Progress Notes (Signed)
For the safety of you and your child, I recommend a face to face office visit with a health care provider.  Many mothers need to take medicines during their pregnancy and while nursing.  Almost all medicines pass into the breast milk in small quantities.  Most are generally considered safe for a mother to take but some medicines must be avoided.  After reviewing your E-Visit request, I recommend that you consult your OB/GYN or pediatrician for medical advice in relation to your condition and prescription medications while pregnant or breastfeeding.  If you are having a true medical emergency please call 911.  If you need an urgent face to face visit, [facility] has four urgent care centers for your convenience.  If you need care fast and have a high deductible or no insurance consider:   [email or web address]  [phone]  [address] [place], [place] [address] 8 am to 8 pm Monday-Friday 10 am to 4 pm Saturday-Sunday   The following sites will take your  insurance:    . [facility] [place] a Provider at this Location  [address] [place], [place] [address] . 10 am to 8 pm Monday-Friday . 12 pm to 8 pm Saturday-Sunday   . [facility] [place] a Provider at this Location  [address] [place], [address] [place], [place] [address] . 8 am to 8 pm Monday-Friday . 9 am to 6 pm Saturday . 11 am to 6 pm Sunday   . [facility] [place] Get Driving Directions  [id number] [place], [place] [address] . 8 am to 8 pm Monday-Friday . 8 am to 4 pm Saturday-Sunday   . [facility] (a walk in primary care provider)  [place] a Provider at this Location  [place], [place] [address] . 8 am to 8:30 pm Monday-Thursday . 8 am  to 6 pm Friday . 8 am to 4 pm Saturday-Sunday   Your e-visit answers were reviewed by a board certified advanced clinical practitioner to complete your personal care plan.  Thank you for using e-Visits.

## 2016-03-05 ENCOUNTER — Ambulatory Visit (INDEPENDENT_AMBULATORY_CARE_PROVIDER_SITE_OTHER): Payer: 59 | Admitting: Family Medicine

## 2016-03-05 VITALS — BP 121/80 | HR 89 | Wt 131.0 lb

## 2016-03-05 DIAGNOSIS — Z3403 Encounter for supervision of normal first pregnancy, third trimester: Secondary | ICD-10-CM | POA: Diagnosis not present

## 2016-03-05 NOTE — Patient Instructions (Signed)
Third Trimester of Pregnancy The third trimester is from week 29 through week 42, months 7 through 9. The third trimester is a time when the fetus is growing rapidly. At the end of the ninth month, the fetus is about 20 inches in length and weighs 6-10 pounds.  BODY CHANGES Your body goes through many changes during pregnancy. The changes vary from woman to woman.   Your weight will continue to increase. You can expect to gain 25-35 pounds (11-16 kg) by the end of the pregnancy.  You may begin to get stretch marks on your hips, abdomen, and breasts.  You may urinate more often because the fetus is moving lower into your pelvis and pressing on your bladder.  You may develop or continue to have heartburn as a result of your pregnancy.  You may develop constipation because certain hormones are causing the muscles that push waste through your intestines to slow down.  You may develop hemorrhoids or swollen, bulging veins (varicose veins).  You may have pelvic pain because of the weight gain and pregnancy hormones relaxing your joints between the bones in your pelvis. Backaches may result from overexertion of the muscles supporting your posture.  You may have changes in your hair. These can include thickening of your hair, rapid growth, and changes in texture. Some women also have hair loss during or after pregnancy, or hair that feels dry or thin. Your hair will most likely return to normal after your baby is born.  Your breasts will continue to grow and be tender. A yellow discharge may leak from your breasts called colostrum.  Your belly button may stick out.  You may feel short of breath because of your expanding uterus.  You may notice the fetus "dropping," or moving lower in your abdomen.  You may have a bloody mucus discharge. This usually occurs a few days to a week before labor begins.  Your cervix becomes thin and soft (effaced) near your due date. WHAT TO EXPECT AT YOUR  PRENATAL EXAMS  You will have prenatal exams every 2 weeks until week 36. Then, you will have weekly prenatal exams. During a routine prenatal visit:  You will be weighed to make sure you and the fetus are growing normally.  Your blood pressure is taken.  Your abdomen will be measured to track your baby's growth.  The fetal heartbeat will be listened to.  Any test results from the previous visit will be discussed.  You may have a cervical check near your due date to see if you have effaced. At around 36 weeks, your caregiver will check your cervix. At the same time, your caregiver will also perform a test on the secretions of the vaginal tissue. This test is to determine if a type of bacteria, Group B streptococcus, is present. Your caregiver will explain this further. Your caregiver may ask you:  What your birth plan is.  How you are feeling.  If you are feeling the baby move.  If you have had any abnormal symptoms, such as leaking fluid, bleeding, severe headaches, or abdominal cramping.  If you are using any tobacco products, including cigarettes, chewing tobacco, and electronic cigarettes.  If you have any questions. Other tests or screenings that may be performed during your third trimester include:  Blood tests that check for low iron levels (anemia).  Fetal testing to check the health, activity level, and growth of the fetus. Testing is done if you have certain medical conditions or if   there are problems during the pregnancy.  HIV (human immunodeficiency virus) testing. If you are at high risk, you may be screened for HIV during your third trimester of pregnancy. FALSE LABOR You may feel small, irregular contractions that eventually go away. These are called Braxton Hicks contractions, or false labor. Contractions may last for hours, days, or even weeks before true labor sets in. If contractions come at regular intervals, intensify, or become painful, it is best to be seen  by your caregiver.  SIGNS OF LABOR   Menstrual-like cramps.  Contractions that are 5 minutes apart or less.  Contractions that start on the top of the uterus and spread down to the lower abdomen and back.  A sense of increased pelvic pressure or back pain.  A watery or bloody mucus discharge that comes from the vagina. If you have any of these signs before the 37th week of pregnancy, call your caregiver right away. You need to go to the hospital to get checked immediately. HOME CARE INSTRUCTIONS   Avoid all smoking, herbs, alcohol, and unprescribed drugs. These chemicals affect the formation and growth of the baby.  Do not use any tobacco products, including cigarettes, chewing tobacco, and electronic cigarettes. If you need help quitting, ask your health care provider. You may receive counseling support and other resources to help you quit.  Follow your caregiver's instructions regarding medicine use. There are medicines that are either safe or unsafe to take during pregnancy.  Exercise only as directed by your caregiver. Experiencing uterine cramps is a good sign to stop exercising.  Continue to eat regular, healthy meals.  Wear a good support bra for breast tenderness.  Do not use hot tubs, steam rooms, or saunas.  Wear your seat belt at all times when driving.  Avoid raw meat, uncooked cheese, cat litter boxes, and soil used by cats. These carry germs that can cause birth defects in the baby.  Take your prenatal vitamins.  Take 1500-2000 mg of calcium daily starting at the 20th week of pregnancy until you deliver your baby.  Try taking a stool softener (if your caregiver approves) if you develop constipation. Eat more high-fiber foods, such as fresh vegetables or fruit and whole grains. Drink plenty of fluids to keep your urine clear or pale yellow.  Take warm sitz baths to soothe any pain or discomfort caused by hemorrhoids. Use hemorrhoid cream if your caregiver  approves.  If you develop varicose veins, wear support hose. Elevate your feet for 15 minutes, 3-4 times a day. Limit salt in your diet.  Avoid heavy lifting, wear low heal shoes, and practice good posture.  Rest a lot with your legs elevated if you have leg cramps or low back pain.  Visit your dentist if you have not gone during your pregnancy. Use a soft toothbrush to brush your teeth and be gentle when you floss.  A sexual relationship may be continued unless your caregiver directs you otherwise.  Do not travel far distances unless it is absolutely necessary and only with the approval of your caregiver.  Take prenatal classes to understand, practice, and ask questions about the labor and delivery.  Make a trial run to the hospital.  Pack your hospital bag.  Prepare the baby's nursery.  Continue to go to all your prenatal visits as directed by your caregiver. SEEK MEDICAL CARE IF:  You are unsure if you are in labor or if your water has broken.  You have dizziness.  You have   mild pelvic cramps, pelvic pressure, or nagging pain in your abdominal area.  You have persistent nausea, vomiting, or diarrhea.  You have a bad smelling vaginal discharge.  You have pain with urination. SEEK IMMEDIATE MEDICAL CARE IF:   You have a fever.  You are leaking fluid from your vagina.  You have spotting or bleeding from your vagina.  You have severe abdominal cramping or pain.  You have rapid weight loss or gain.  You have shortness of breath with chest pain.  You notice sudden or extreme swelling of your face, hands, ankles, feet, or legs.  You have not felt your baby move in over an hour.  You have severe headaches that do not go away with medicine.  You have vision changes.   This information is not intended to replace advice given to you by your health care provider. Make sure you discuss any questions you have with your health care provider.   Document Released:  04/30/2001 Document Revised: 05/27/2014 Document Reviewed: 07/07/2012 Elsevier Interactive Patient Education 2016 [employer]  Breastfeeding Deciding to breastfeed is one of the best choices you can make for you and your baby. A change in hormones during pregnancy causes your breast tissue to grow and increases the number and size of your milk ducts. These hormones also allow proteins, sugars, and fats from your blood supply to make breast milk in your milk-producing glands. Hormones prevent breast milk from being released before your baby is born as well as prompt milk flow after birth. Once breastfeeding has begun, thoughts of your baby, as well as his or her sucking or crying, can stimulate the release of milk from your milk-producing glands.  BENEFITS OF BREASTFEEDING For Your Baby  Your first milk (colostrum) helps your baby's digestive system function better.  There are antibodies in your milk that help your baby fight off infections.  Your baby has a lower incidence of asthma, allergies, and sudden infant death syndrome.  The nutrients in breast milk are better for your baby than infant formulas and are designed uniquely for your baby's needs.  Breast milk improves your baby's brain development.  Your baby is less likely to develop other conditions, such as childhood obesity, asthma, or type 2 diabetes mellitus. For You  Breastfeeding helps to create a very special bond between you and your baby.  Breastfeeding is convenient. Breast milk is always available at the correct temperature and costs nothing.  Breastfeeding helps to burn calories and helps you lose the weight gained during pregnancy.  Breastfeeding makes your uterus contract to its prepregnancy size faster and slows bleeding (lochia) after you give birth.   Breastfeeding helps to lower your risk of developing type 2 diabetes mellitus, osteoporosis, and breast or ovarian cancer later in life. SIGNS THAT YOUR BABY IS  HUNGRY Early Signs of Hunger  Increased alertness or activity.  Stretching.  Movement of the head from side to side.  Movement of the head and opening of the mouth when the corner of the mouth or cheek is stroked (rooting).  Increased sucking sounds, smacking lips, cooing, sighing, or squeaking.  Hand-to-mouth movements.  Increased sucking of fingers or hands. Late Signs of Hunger  Fussing.  Intermittent crying. Extreme Signs of Hunger Signs of extreme hunger will require calming and consoling before your baby will be able to breastfeed successfully. Do not wait for the following signs of extreme hunger to occur before you initiate breastfeeding:  Restlessness.  A loud, strong cry.  Screaming.   BREASTFEEDING BASICS Breastfeeding Initiation  Find a comfortable place to sit or lie down, with your neck and back well supported.  Place a pillow or rolled up blanket under your baby to bring him or her to the level of your breast (if you are seated). Nursing pillows are specially designed to help support your arms and your baby while you breastfeed.  Make sure that your baby's abdomen is facing your abdomen.  Gently massage your breast. With your fingertips, massage from your chest wall toward your nipple in a circular motion. This encourages milk flow. You may need to continue this action during the feeding if your milk flows slowly.  Support your breast with 4 fingers underneath and your thumb above your nipple. Make sure your fingers are well away from your nipple and your baby's mouth.  Stroke your baby's lips gently with your finger or nipple.  When your baby's mouth is open wide enough, quickly bring your baby to your breast, placing your entire nipple and as much of the colored area around your nipple (areola) as possible into your baby's mouth.  More areola should be visible above your baby's upper lip than below the lower lip.  Your baby's tongue should be between his  or her lower gum and your breast.  Ensure that your baby's mouth is correctly positioned around your nipple (latched). Your baby's lips should create a seal on your breast and be turned out (everted).  It is common for your baby to suck about 2-3 minutes in order to start the flow of breast milk. Latching Teaching your baby how to latch on to your breast properly is very important. An improper latch can cause nipple pain and decreased milk supply for you and poor weight gain in your baby. Also, if your baby is not latched onto your nipple properly, he or she may swallow some air during feeding. This can make your baby fussy. Burping your baby when you switch breasts during the feeding can help to get rid of the air. However, teaching your baby to latch on properly is still the best way to prevent fussiness from swallowing air while breastfeeding. Signs that your baby has successfully latched on to your nipple:  Silent tugging or silent sucking, without causing you pain.  Swallowing heard between every 3-4 sucks.  Muscle movement above and in front of his or her ears while sucking. Signs that your baby has not successfully latched on to nipple:  Sucking sounds or smacking sounds from your baby while breastfeeding.  Nipple pain. If you think your baby has not latched on correctly, slip your finger into the corner of your baby's mouth to break the suction and place it between your baby's gums. Attempt breastfeeding initiation again. Signs of Successful Breastfeeding Signs from your baby:  A gradual decrease in the number of sucks or complete cessation of sucking.  Falling asleep.  Relaxation of his or her body.  Retention of a small amount of milk in his or her mouth.  Letting go of your breast by himself or herself. Signs from you:  Breasts that have increased in firmness, weight, and size 1-3 hours after feeding.  Breasts that are softer immediately after  breastfeeding.  Increased milk volume, as well as a change in milk consistency and color by the fifth day of breastfeeding.  Nipples that are not sore, cracked, or bleeding. Signs That Your Baby is Getting Enough Milk  Wetting at least 3 diapers in a 24-hour period.   The urine should be clear and pale yellow by age 5 days.  At least 3 stools in a 24-hour period by age 5 days. The stool should be soft and yellow.  At least 3 stools in a 24-hour period by age 7 days. The stool should be seedy and yellow.  No loss of weight greater than 10% of birth weight during the first 3 days of age.  Average weight gain of 4-7 ounces (113-198 g) per week after age 4 days.  Consistent daily weight gain by age 5 days, without weight loss after the age of 2 weeks. After a feeding, your baby may spit up a small amount. This is common. BREASTFEEDING FREQUENCY AND DURATION Frequent feeding will help you make more milk and can prevent sore nipples and breast engorgement. Breastfeed when you feel the need to reduce the fullness of your breasts or when your baby shows signs of hunger. This is called "breastfeeding on demand." Avoid introducing a pacifier to your baby while you are working to establish breastfeeding (the first 4-6 weeks after your baby is born). After this time you may choose to use a pacifier. Research has shown that pacifier use during the first year of a baby's life decreases the risk of sudden infant death syndrome (SIDS). Allow your baby to feed on each breast as long as he or she wants. Breastfeed until your baby is finished feeding. When your baby unlatches or falls asleep while feeding from the first breast, offer the second breast. Because newborns are often sleepy in the first few weeks of life, you may need to awaken your baby to get him or her to feed. Breastfeeding times will vary from baby to baby. However, the following rules can serve as a guide to help you ensure that your baby is  properly fed:  Newborns (babies 4 weeks of age or younger) may breastfeed every 1-3 hours.  Newborns should not go longer than 3 hours during the day or 5 hours during the night without breastfeeding.  You should breastfeed your baby a minimum of 8 times in a 24-hour period until you begin to introduce solid foods to your baby at around 6 months of age. BREAST MILK PUMPING Pumping and storing breast milk allows you to ensure that your baby is exclusively fed your breast milk, even at times when you are unable to breastfeed. This is especially important if you are going back to work while you are still breastfeeding or when you are not able to be present during feedings. Your lactation consultant can give you guidelines on how long it is safe to store breast milk. A breast pump is a machine that allows you to pump milk from your breast into a sterile bottle. The pumped breast milk can then be stored in a refrigerator or freezer. Some breast pumps are operated by hand, while others use electricity. Ask your lactation consultant which type will work best for you. Breast pumps can be purchased, but some hospitals and breastfeeding support groups lease breast pumps on a monthly basis. A lactation consultant can teach you how to hand express breast milk, if you prefer not to use a pump. CARING FOR YOUR BREASTS WHILE YOU BREASTFEED Nipples can become dry, cracked, and sore while breastfeeding. The following recommendations can help keep your breasts moisturized and healthy:  Avoid using soap on your nipples.  Wear a supportive bra. Although not required, special nursing bras and tank tops are designed to allow access to your   breasts for breastfeeding without taking off your entire bra or top. Avoid wearing underwire-style bras or extremely tight bras.  Air dry your nipples for 3-4minutes after each feeding.  Use only cotton bra pads to absorb leaked breast milk. Leaking of breast milk between feedings  is normal.  Use lanolin on your nipples after breastfeeding. Lanolin helps to maintain your skin's normal moisture barrier. If you use pure lanolin, you do not need to wash it off before feeding your baby again. Pure lanolin is not toxic to your baby. You may also hand express a few drops of breast milk and gently massage that milk into your nipples and allow the milk to air dry. In the first few weeks after giving birth, some women experience extremely full breasts (engorgement). Engorgement can make your breasts feel heavy, warm, and tender to the touch. Engorgement peaks within 3-5 days after you give birth. The following recommendations can help ease engorgement:  Completely empty your breasts while breastfeeding or pumping. You may want to start by applying warm, moist heat (in the shower or with warm water-soaked hand towels) just before feeding or pumping. This increases circulation and helps the milk flow. If your baby does not completely empty your breasts while breastfeeding, pump any extra milk after he or she is finished.  Wear a snug bra (nursing or regular) or tank top for 1-2 days to signal your body to slightly decrease milk production.  Apply ice packs to your breasts, unless this is too uncomfortable for you.  Make sure that your baby is latched on and positioned properly while breastfeeding. If engorgement persists after 48 hours of following these recommendations, contact your health care provider or a lactation consultant. OVERALL HEALTH CARE RECOMMENDATIONS WHILE BREASTFEEDING  Eat healthy foods. Alternate between meals and snacks, eating 3 of each per day. Because what you eat affects your breast milk, some of the foods may make your baby more irritable than usual. Avoid eating these foods if you are sure that they are negatively affecting your baby.  Drink milk, fruit juice, and water to satisfy your thirst (about 10 glasses a day).  Rest often, relax, and continue to take  your prenatal vitamins to prevent fatigue, stress, and anemia.  Continue breast self-awareness checks.  Avoid chewing and smoking tobacco. Chemicals from cigarettes that pass into breast milk and exposure to secondhand smoke may harm your baby.  Avoid alcohol and drug use, including marijuana. Some medicines that may be harmful to your baby can pass through breast milk. It is important to ask your health care provider before taking any medicine, including all over-the-counter and prescription medicine as well as vitamin and herbal supplements. It is possible to become pregnant while breastfeeding. If birth control is desired, ask your health care provider about options that will be safe for your baby. SEEK MEDICAL CARE IF:  You feel like you want to stop breastfeeding or have become frustrated with breastfeeding.  You have painful breasts or nipples.  Your nipples are cracked or bleeding.  Your breasts are red, tender, or warm.  You have a swollen area on either breast.  You have a fever or chills.  You have nausea or vomiting.  You have drainage other than breast milk from your nipples.  Your breasts do not become full before feedings by the fifth day after you give birth.  You feel sad and depressed.  Your baby is too sleepy to eat well.  Your baby is having trouble sleeping.     Your baby is wetting less than 3 diapers in a 24-hour period.  Your baby has less than 3 stools in a 24-hour period.  Your baby's skin or the white part of his or her eyes becomes yellow.   Your baby is not gaining weight by 5 days of age. SEEK IMMEDIATE MEDICAL CARE IF:  Your baby is overly tired (lethargic) and does not want to wake up and feed.  Your baby develops an unexplained fever.   This information is not intended to replace advice given to you by your health care provider. Make sure you discuss any questions you have with your health care provider.   Document Released: 05/06/2005  Document Revised: 01/25/2015 Document Reviewed: 10/28/2012 Elsevier Interactive Patient Education 2016 [employer]  

## 2016-03-05 NOTE — Progress Notes (Signed)
143Pt is compliant on [employer].

## 2016-03-05 NOTE — Progress Notes (Signed)
   PRENATAL VISIT NOTE  Subjective:  Sally Oliver is a 25 y.o. G1P0000 at [redacted]w[redacted]d being seen today for ongoing prenatal care.  She is currently monitored for the following issues for this low-risk pregnancy and has Fibroadenoma of left breast; Supervision of normal pregnancy; and Rh negative, antepartum on her problem list.  Patient reports no complaints.  Contractions: Not present. Vag. Bleeding: None.  Movement: Present. Denies leaking of fluid.   The following portions of the patient's history were reviewed and updated as appropriate: allergies, current medications, past family history, past medical history, past social history, past surgical history and problem list. Problem list updated.  Objective:   Vitals:   03/05/16 0824  BP: 121/80  Pulse: 89  Weight: 131 lb (59.4 kg)    Fetal Status: Fetal Heart Rate (bpm): 143 Fundal Height: 32 cm Movement: Present     General:  Alert, oriented and cooperative. Patient is in no acute distress.  Skin: Skin is warm and dry. No rash noted.   Cardiovascular: Normal heart rate noted  Respiratory: Normal respiratory effort, no problems with respiration noted  Abdomen: Soft, gravid, appropriate for gestational age. Pain/Pressure: Absent     Pelvic:  Cervical exam deferred        Extremities: Normal range of motion.  Edema: None  Mental Status: Normal mood and affect. Normal behavior. Normal judgment and thought content.   Assessment and Plan:  Pregnancy: G1P0000 at [redacted]w[redacted]d  1. Encounter for supervision of normal first pregnancy in third trimester Continue routine prenatal care.   Preterm labor symptoms and general obstetric precautions including but not limited to vaginal bleeding, contractions, leaking of fluid and fetal movement were reviewed in detail with the patient. Please refer to After Visit Summary for other counseling recommendations.  Return in 4 weeks (on 04/02/2016).  Donnamae Jude, MD

## 2016-03-19 ENCOUNTER — Telehealth: Payer: Self-pay | Admitting: *Deleted

## 2016-03-19 NOTE — Telephone Encounter (Signed)
Called pt to adv removed from [place] for her to rtn call

## 2016-03-19 NOTE — Telephone Encounter (Signed)
-----   Message from Francia Greaves sent at 03/19/2016  1:35 PM EDT ----- Sally Oliver from Sally Oliver, Transferred Sally Oliver

## 2016-04-02 ENCOUNTER — Encounter: Payer: 59 | Admitting: Family Medicine

## 2016-04-29 ENCOUNTER — Inpatient Hospital Stay (HOSPITAL_COMMUNITY): Admission: AD | Admit: 2016-04-29 | Payer: 59 | Source: Ambulatory Visit | Admitting: Family Medicine

## 2016-07-17 ENCOUNTER — Encounter (HOSPITAL_COMMUNITY): Payer: Self-pay

## 2017-09-29 ENCOUNTER — Ambulatory Visit (INDEPENDENT_AMBULATORY_CARE_PROVIDER_SITE_OTHER): Payer: BLUE CROSS/BLUE SHIELD | Admitting: Family Medicine

## 2017-09-29 ENCOUNTER — Encounter: Payer: Self-pay | Admitting: Family Medicine

## 2017-09-29 VITALS — BP 114/64 | HR 61 | Temp 97.6°F | Ht 61.0 in | Wt 108.0 lb

## 2017-09-29 DIAGNOSIS — J0100 Acute maxillary sinusitis, unspecified: Secondary | ICD-10-CM | POA: Diagnosis not present

## 2017-09-29 MED ORDER — AMOXICILLIN-POT CLAVULANATE 875-125 MG PO TABS: 1.0000 | ORAL_TABLET | Freq: Two times a day (BID) | ORAL | 0 refills | Status: DC

## 2017-09-29 MED ORDER — PREDNISONE 20 MG PO TABS: 40.0000 mg | ORAL_TABLET | Freq: Every day | ORAL | 0 refills | Status: DC

## 2017-09-29 NOTE — Progress Notes (Signed)
Patient: Sally Oliver MRN: [id number] DOB: 12-Dec-1990 PCP: Orma Flaming, MD     Subjective:  Chief Complaint  Patient presents with  . Sinus Problem    HPI: The patient is a 27 y.o. female who presents today for sinus issues and right ear pain.  Ear pain started on Saturday. No drainage/hearing loss. She has had sinus issues for a few weeks that has progressively gotten worse. She originally thought it was her allergies. She has had no fever/chills. She has sinus pain/pressure in bilateral maxillary areas and right sided tooth pain. She states her snot has changed to dark green in color. She has a cough due to post nasal drip and dry throat. No shortness of breath or wheezing. She does not smoke and does not have asthma. She has done alkaseltzer sinus, claritin and zyrtec. These have only provided temporary relief. Her daughter was just recently diagnosed with an ear infection and pink eye. She leaves for [facility] on Wednesday for a work trip.   Review of Systems  Constitutional: Negative for appetite change, chills, fatigue and fever.  HENT: Positive for congestion, ear pain, rhinorrhea, sinus pressure and sinus pain. Negative for dental problem, ear discharge, facial swelling and mouth sores.   Eyes: Negative for discharge and itching.  Respiratory: Positive for cough. Negative for chest tightness and shortness of breath.   Cardiovascular: Negative for chest pain and palpitations.  Gastrointestinal: Negative for abdominal pain, diarrhea, nausea and vomiting.  Endocrine: Negative for cold intolerance and heat intolerance.  Genitourinary: Negative for flank pain and pelvic pain.  Musculoskeletal: Negative for joint swelling, myalgias, neck pain and neck stiffness.  Skin: Negative for color change, pallor and rash.  Neurological: Negative for dizziness, syncope, speech difficulty, weakness, numbness and headaches.  Hematological: Does not bruise/bleed easily.   Psychiatric/Behavioral: Negative for agitation, behavioral problems, confusion, sleep disturbance and suicidal ideas. The patient is not nervous/anxious.     Allergies Patient has No Known Allergies.  Past Medical History Patient  has a past medical history of Chicken pox, Migraine headache, and Scoliosis.  Surgical History Patient  has a past surgical history that includes Wisdom tooth extraction; Breast lumpectomy (2011); and Breast biopsy (2014).  Family History Pateint's family history includes Arthritis in her father; Diverticulitis in her paternal grandmother; Healthy in her maternal grandmother; Heart disease in her father; Migraines in her mother; Stroke in her father and maternal grandfather.  Social History Patient  reports that she has never smoked. She has never used smokeless tobacco. She reports that she drinks alcohol. She reports that she does not use drugs.    Objective: Vitals:   09/29/17 0918  BP: 114/64  Pulse: 61  Temp: 97.6 F (36.4 C)  TempSrc: Oral  SpO2: 98%  Weight: 108 lb (49 kg)  Height: 5\' 1"  (1.549 m)    Body mass index is 20.41 kg/m.  Physical Exam  Constitutional: She appears well-developed and well-nourished.  HENT:  Right Ear: External ear normal.  Left Ear: External ear normal.  Nose: Nose normal.  Mouth/Throat: Oropharynx is clear and moist. No oropharyngeal exudate.  Cobblestoning on posterior pharynx. TTP over right sided maxillary sinus   Eyes: Conjunctivae are normal.  Cardiovascular: Normal rate, regular rhythm and normal heart sounds.  Pulmonary/Chest: Effort normal and breath sounds normal. She has no wheezes. She has no rales.  Lymphadenopathy:    She has no cervical adenopathy.  Vitals reviewed.      Assessment/plan: 1. Acute non-recurrent maxillary sinusitis Conservative  therapy with cool mist humidifier. flonase and zyrtec daily. 10 day course of augmentin. Recommended she take with food and probiotic. Side effects  of medication discussed including diarrhea, N/V, stomach upset. She declines steroid shot today, will send in steroid burst since flying to [place] and she can start if pressure/congestion do not get better with flonase/cool mist humidifier and abx. F/u as needed.       Return if symptoms worsen or fail to improve.     Orma Flaming, MD [place]  09/29/2017

## 2017-12-16 NOTE — Progress Notes (Signed)
Patient: Sally Oliver MRN: [id number] DOB: 10/07/1990 PCP: Orma Flaming, MD     Subjective:  Chief Complaint  Patient presents with  . Annual Exam    pt is fasting    HPI: The patient I/s a 27 y.o. female who presents today for annual exam. She denies any changes to past medical history. There have been no recent hospitalizations. They are following a well balanced diet and exercise plan. Weight has been stable. No complaints today.   Immunization History  Administered Date(s) Administered  . Influenza,inj,Quad PF,6+ Mos 02/06/2016  . Tdap 02/06/2016   Pap smear: 01/2016 hiv screen: done tdap utd  Review of Systems  Constitutional: Negative for activity change, chills, fatigue and fever.  HENT: Negative for dental problem, ear pain, hearing loss and trouble swallowing.   Eyes: Negative for visual disturbance.  Respiratory: Negative for cough, chest tightness and shortness of breath.   Cardiovascular: Negative for chest pain, palpitations and leg swelling.  Gastrointestinal: Negative for abdominal pain, blood in stool, diarrhea and nausea.  Endocrine: Negative for cold intolerance, polydipsia, polyphagia and polyuria.  Genitourinary: Negative.  Negative for dysuria and hematuria.  Musculoskeletal: Negative for arthralgias, back pain and neck pain.  Skin: Negative.  Negative for rash.  Neurological: Negative for dizziness and headaches.  Psychiatric/Behavioral: Negative for dysphoric mood and sleep disturbance. The patient is not nervous/anxious.     Allergies Patient has No Known Allergies.  Past Medical History Patient  has a past medical history of Chicken pox, Migraine headache, and Scoliosis.  Surgical History Patient  has a past surgical history that includes Wisdom tooth extraction; Breast lumpectomy (2011); and Breast biopsy (2014).  Family History Pateint's family history includes Arthritis in her father; Diverticulitis in her paternal grandmother;  Healthy in her maternal grandmother; Heart disease in her father; Migraines in her mother; Stroke in her father and maternal grandfather.  Social History Patient  reports that she has never smoked. She has never used smokeless tobacco. She reports that she drinks alcohol. She reports that she does not use drugs.    Objective: Vitals:   12/17/17 0918  BP: 92/64  Pulse: 86  Temp: 98.1 F (36.7 C)  TempSrc: Oral  SpO2: 97%  Weight: 107 lb 12.8 oz (48.9 kg)  Height: 5' 1.5" (1.562 m)    Body mass index is 20.04 kg/m.  Physical Exam  Constitutional: She is oriented to person, place, and time. She appears well-developed and well-nourished.  HENT:  Right Ear: External ear normal.  Left Ear: External ear normal.  Mouth/Throat: Oropharynx is clear and moist.  Eyes: Pupils are equal, round, and reactive to light. Conjunctivae and EOM are normal.  Neck: Normal range of motion. Neck supple. No thyromegaly present.  Cardiovascular: Normal rate, regular rhythm, normal heart sounds and intact distal pulses.  No murmur heard. Pulmonary/Chest: Effort normal and breath sounds normal.  Abdominal: Soft. Bowel sounds are normal. She exhibits no distension. There is no tenderness.  Lymphadenopathy:    She has no cervical adenopathy.  Neurological: She is alert and oriented to person, place, and time. She displays normal reflexes. No cranial nerve deficit. Coordination normal.  Skin: Skin is warm and dry. No rash noted.  Psychiatric: She has a normal mood and affect. Her behavior is normal.  Vitals reviewed.      Assessment/plan: 1. Annual physical exam Routine lab work today.  Very healthy. Recommended starting prenatal vitamin since will be trying to get pregnant in a few months. Already has  dental home. Sun screen discussed. F/u in one year or as needed.   Patient counseling [x]    Nutrition: Stressed importance of moderation in sodium/caffeine intake, saturated fat and cholesterol, caloric  balance, sufficient intake of fresh fruits, vegetables, fiber, calcium, iron, and 1 mg of folate supplement per day (for females capable of pregnancy).  [x]    Stressed the importance of regular exercise.   []    Substance Abuse: Discussed cessation/primary prevention of tobacco, alcohol, or other drug use; driving or other dangerous activities under the influence; availability of treatment for abuse.   [x]    Injury prevention: Discussed safety belts, safety helmets, smoke detector, smoking near bedding or upholstery.   [x]    Sexuality: Discussed sexually transmitted diseases, partner selection, use of condoms, avoidance of unintended pregnancy  and contraceptive alternatives.  [x]    Dental health: Discussed importance of regular tooth brushing, flossing, and dental visits.  [x]    Health maintenance and immunizations reviewed. Please refer to Health maintenance section.    - CBC with Differential/Platelet - Comprehensive metabolic panel - TSH - Lipid panel     Return in about 1 year (around 12/18/2018).     Orma Flaming, MD [place]  12/17/2017

## 2017-12-17 ENCOUNTER — Ambulatory Visit (INDEPENDENT_AMBULATORY_CARE_PROVIDER_SITE_OTHER): Payer: BLUE CROSS/BLUE SHIELD | Admitting: Family Medicine

## 2017-12-17 ENCOUNTER — Encounter: Payer: Self-pay | Admitting: Family Medicine

## 2017-12-17 VITALS — BP 92/64 | HR 86 | Temp 98.1°F | Ht 61.5 in | Wt 107.8 lb

## 2017-12-17 DIAGNOSIS — Z0000 Encounter for general adult medical examination without abnormal findings: Secondary | ICD-10-CM

## 2017-12-17 LAB — CBC WITH DIFFERENTIAL/PLATELET
BASOS ABS: 0.1 10*3/uL (ref 0.0–0.1)
BASOS PCT: 1.2 % (ref 0.0–3.0)
Eosinophils Absolute: 0.1 10*3/uL (ref 0.0–0.7)
Eosinophils Relative: 3.2 % (ref 0.0–5.0)
HCT: 41.6 % (ref 36.0–46.0)
Hemoglobin: 13.9 g/dL (ref 12.0–15.0)
LYMPHS PCT: 34.1 % (ref 12.0–46.0)
Lymphs Abs: 1.5 10*3/uL (ref 0.7–4.0)
MCHC: 33.4 g/dL (ref 30.0–36.0)
MCV: 81.6 fl (ref 78.0–100.0)
MONOS PCT: 5.2 % (ref 3.0–12.0)
Monocytes Absolute: 0.2 10*3/uL (ref 0.1–1.0)
NEUTROS ABS: 2.5 10*3/uL (ref 1.4–7.7)
Neutrophils Relative %: 56.3 % (ref 43.0–77.0)
Platelets: 249 10*3/uL (ref 150.0–400.0)
RBC: 5.1 Mil/uL (ref 3.87–5.11)
RDW: 14.2 % (ref 11.5–15.5)
WBC: 4.4 10*3/uL (ref 4.0–10.5)

## 2017-12-17 LAB — LIPID PANEL
Cholesterol: 180 mg/dL (ref 0–200)
HDL: 85.7 mg/dL (ref >39.00)
LDL Cholesterol: 77 mg/dL (ref 0–99)
NONHDL: 94.44
Total CHOL/HDL Ratio: 2
Triglycerides: 86 mg/dL (ref 0.0–149.0)
VLDL: 17.2 mg/dL (ref 0.0–40.0)

## 2017-12-17 LAB — COMPREHENSIVE METABOLIC PANEL
ALK PHOS: 50 U/L (ref 39–117)
ALT: 11 U/L (ref 0–35)
AST: 17 U/L (ref 0–37)
Albumin: 4.9 g/dL (ref 3.5–5.2)
BILIRUBIN TOTAL: 0.6 mg/dL (ref 0.2–1.2)
BUN: 12 mg/dL (ref 6–23)
CO2: 28 mEq/L (ref 19–32)
CREATININE: 0.76 mg/dL (ref 0.40–1.20)
Calcium: 9.8 mg/dL (ref 8.4–10.5)
Chloride: 104 mEq/L (ref 96–112)
GFR: 95.53 mL/min (ref >60.00)
Glucose, Bld: 90 mg/dL (ref 70–99)
Potassium: 4.6 mEq/L (ref 3.5–5.1)
SODIUM: 138 meq/L (ref 135–145)
TOTAL PROTEIN: 7.4 g/dL (ref 6.0–8.3)

## 2017-12-17 LAB — TSH: TSH: 1.29 u[IU]/mL (ref 0.35–4.50)

## 2017-12-18 LAB — MEASLES/MUMPS/RUBELLA IMMUNITY
MUMPS IGG: 80.9 [AU]/ml
RUBELLA: 2.94 {index}
Rubeola IgG: >300 AU/mL

## 2018-01-28 ENCOUNTER — Encounter: Payer: Self-pay | Admitting: Family Medicine

## 2018-09-23 DIAGNOSIS — O039 Complete or unspecified spontaneous abortion without complication: Secondary | ICD-10-CM | POA: Diagnosis not present

## 2018-09-24 DIAGNOSIS — Z3149 Encounter for other procreative investigation and testing: Secondary | ICD-10-CM | POA: Diagnosis not present

## 2018-09-24 DIAGNOSIS — O039 Complete or unspecified spontaneous abortion without complication: Secondary | ICD-10-CM | POA: Diagnosis not present

## 2018-10-13 DIAGNOSIS — Z3200 Encounter for pregnancy test, result unknown: Secondary | ICD-10-CM | POA: Diagnosis not present

## 2018-10-21 DIAGNOSIS — Z3200 Encounter for pregnancy test, result unknown: Secondary | ICD-10-CM | POA: Diagnosis not present

## 2018-10-23 DIAGNOSIS — Z3201 Encounter for pregnancy test, result positive: Secondary | ICD-10-CM | POA: Diagnosis not present

## 2018-11-04 DIAGNOSIS — Z3201 Encounter for pregnancy test, result positive: Secondary | ICD-10-CM | POA: Diagnosis not present

## 2018-12-10 DIAGNOSIS — Z3481 Encounter for supervision of other normal pregnancy, first trimester: Secondary | ICD-10-CM | POA: Diagnosis not present

## 2018-12-10 DIAGNOSIS — Z113 Encounter for screening for infections with a predominantly sexual mode of transmission: Secondary | ICD-10-CM | POA: Diagnosis not present

## 2018-12-10 DIAGNOSIS — Z1151 Encounter for screening for human papillomavirus (HPV): Secondary | ICD-10-CM | POA: Diagnosis not present

## 2018-12-10 DIAGNOSIS — Z3689 Encounter for other specified antenatal screening: Secondary | ICD-10-CM | POA: Diagnosis not present

## 2018-12-10 LAB — OB RESULTS CONSOLE RUBELLA ANTIBODY, IGM: Rubella: IMMUNE

## 2018-12-10 LAB — OB RESULTS CONSOLE HEPATITIS B SURFACE ANTIGEN: Hepatitis B Surface Ag: NEGATIVE

## 2018-12-10 LAB — OB RESULTS CONSOLE ABO/RH: RH Type: NEGATIVE

## 2018-12-10 LAB — OB RESULTS CONSOLE GC/CHLAMYDIA
Chlamydia: NEGATIVE
Gonorrhea: NEGATIVE

## 2018-12-10 LAB — OB RESULTS CONSOLE ANTIBODY SCREEN: Antibody Screen: NEGATIVE

## 2018-12-10 LAB — OB RESULTS CONSOLE HIV ANTIBODY (ROUTINE TESTING): HIV: NONREACTIVE

## 2018-12-10 LAB — OB RESULTS CONSOLE RPR: RPR: NONREACTIVE

## 2019-01-07 DIAGNOSIS — Z3482 Encounter for supervision of other normal pregnancy, second trimester: Secondary | ICD-10-CM | POA: Diagnosis not present

## 2019-02-02 DIAGNOSIS — Z3482 Encounter for supervision of other normal pregnancy, second trimester: Secondary | ICD-10-CM | POA: Diagnosis not present

## 2019-02-02 DIAGNOSIS — Z362 Encounter for other antenatal screening follow-up: Secondary | ICD-10-CM | POA: Diagnosis not present

## 2019-03-01 DIAGNOSIS — Z3482 Encounter for supervision of other normal pregnancy, second trimester: Secondary | ICD-10-CM | POA: Diagnosis not present

## 2019-03-01 DIAGNOSIS — Z23 Encounter for immunization: Secondary | ICD-10-CM | POA: Diagnosis not present

## 2019-03-01 DIAGNOSIS — L299 Pruritus, unspecified: Secondary | ICD-10-CM | POA: Diagnosis not present

## 2019-04-02 DIAGNOSIS — Z3483 Encounter for supervision of other normal pregnancy, third trimester: Secondary | ICD-10-CM | POA: Diagnosis not present

## 2019-04-02 DIAGNOSIS — Z3689 Encounter for other specified antenatal screening: Secondary | ICD-10-CM | POA: Diagnosis not present

## 2019-04-02 DIAGNOSIS — O360130 Maternal care for anti-D [Rh] antibodies, third trimester, not applicable or unspecified: Secondary | ICD-10-CM | POA: Diagnosis not present

## 2019-04-02 DIAGNOSIS — Z3A28 28 weeks gestation of pregnancy: Secondary | ICD-10-CM | POA: Diagnosis not present

## 2019-04-23 DIAGNOSIS — L299 Pruritus, unspecified: Secondary | ICD-10-CM | POA: Diagnosis not present

## 2019-04-23 DIAGNOSIS — Z3483 Encounter for supervision of other normal pregnancy, third trimester: Secondary | ICD-10-CM | POA: Diagnosis not present

## 2019-05-07 DIAGNOSIS — Z23 Encounter for immunization: Secondary | ICD-10-CM | POA: Diagnosis not present

## 2019-05-07 DIAGNOSIS — Z3A33 33 weeks gestation of pregnancy: Secondary | ICD-10-CM | POA: Diagnosis not present

## 2019-05-07 DIAGNOSIS — L299 Pruritus, unspecified: Secondary | ICD-10-CM | POA: Diagnosis not present

## 2019-05-07 DIAGNOSIS — O365930 Maternal care for other known or suspected poor fetal growth, third trimester, not applicable or unspecified: Secondary | ICD-10-CM | POA: Diagnosis not present

## 2019-05-11 DIAGNOSIS — M9905 Segmental and somatic dysfunction of pelvic region: Secondary | ICD-10-CM | POA: Diagnosis not present

## 2019-05-11 DIAGNOSIS — M9904 Segmental and somatic dysfunction of sacral region: Secondary | ICD-10-CM | POA: Diagnosis not present

## 2019-05-11 DIAGNOSIS — M545 Low back pain: Secondary | ICD-10-CM | POA: Diagnosis not present

## 2019-05-11 DIAGNOSIS — M9903 Segmental and somatic dysfunction of lumbar region: Secondary | ICD-10-CM | POA: Diagnosis not present

## 2019-05-12 DIAGNOSIS — M9903 Segmental and somatic dysfunction of lumbar region: Secondary | ICD-10-CM | POA: Diagnosis not present

## 2019-05-12 DIAGNOSIS — O365930 Maternal care for other known or suspected poor fetal growth, third trimester, not applicable or unspecified: Secondary | ICD-10-CM | POA: Diagnosis not present

## 2019-05-12 DIAGNOSIS — M9904 Segmental and somatic dysfunction of sacral region: Secondary | ICD-10-CM | POA: Diagnosis not present

## 2019-05-12 DIAGNOSIS — M9905 Segmental and somatic dysfunction of pelvic region: Secondary | ICD-10-CM | POA: Diagnosis not present

## 2019-05-12 DIAGNOSIS — Z3A33 33 weeks gestation of pregnancy: Secondary | ICD-10-CM | POA: Diagnosis not present

## 2019-05-12 DIAGNOSIS — M545 Low back pain: Secondary | ICD-10-CM | POA: Diagnosis not present

## 2019-05-17 DIAGNOSIS — M9903 Segmental and somatic dysfunction of lumbar region: Secondary | ICD-10-CM | POA: Diagnosis not present

## 2019-05-17 DIAGNOSIS — M9904 Segmental and somatic dysfunction of sacral region: Secondary | ICD-10-CM | POA: Diagnosis not present

## 2019-05-17 DIAGNOSIS — M545 Low back pain: Secondary | ICD-10-CM | POA: Diagnosis not present

## 2019-05-17 DIAGNOSIS — M9905 Segmental and somatic dysfunction of pelvic region: Secondary | ICD-10-CM | POA: Diagnosis not present

## 2019-05-19 DIAGNOSIS — M9903 Segmental and somatic dysfunction of lumbar region: Secondary | ICD-10-CM | POA: Diagnosis not present

## 2019-05-19 DIAGNOSIS — M545 Low back pain: Secondary | ICD-10-CM | POA: Diagnosis not present

## 2019-05-19 DIAGNOSIS — M9904 Segmental and somatic dysfunction of sacral region: Secondary | ICD-10-CM | POA: Diagnosis not present

## 2019-05-19 DIAGNOSIS — M9905 Segmental and somatic dysfunction of pelvic region: Secondary | ICD-10-CM | POA: Diagnosis not present

## 2019-05-20 DIAGNOSIS — Z3685 Encounter for antenatal screening for Streptococcus B: Secondary | ICD-10-CM | POA: Diagnosis not present

## 2019-05-20 DIAGNOSIS — O26613 Liver and biliary tract disorders in pregnancy, third trimester: Secondary | ICD-10-CM | POA: Diagnosis not present

## 2019-05-20 DIAGNOSIS — Z0371 Encounter for suspected problem with amniotic cavity and membrane ruled out: Secondary | ICD-10-CM | POA: Diagnosis not present

## 2019-05-20 DIAGNOSIS — Z3A34 34 weeks gestation of pregnancy: Secondary | ICD-10-CM | POA: Diagnosis not present

## 2019-05-20 LAB — OB RESULTS CONSOLE GBS: GBS: POSITIVE

## 2019-05-21 NOTE — L&D Delivery Note (Signed)
Delivery Note  37 wks IOL for ICP Cervical ripening overnight with Cytotec 50 mcg buccal and CB placed in AM AROM with clear AF 0942 Pitocin augmentation last hour of labor.   At 12:38 PM a viable female was delivered via Vaginal, Spontaneous (Presentation: Left Occiput Anterior).  APGAR: 8, 10; weight  pending.   Placenta status: Spontaneous, Intact.  Cord: 3 vessels with the following complications: None.  Cord blood collected for typing  Anesthesia: Epidural Episiotomy: None Lacerations: None Suture Repair: none Est. Blood Loss (mL): 300  Mom to postpartum.  Baby Leah to Couplet care / Skin to Skin.  Small 0.5 pedunculated mole ligated and removed from mons, to pathology.    Juliene Pina, CNM 06/04/2019, 1:01 PM

## 2019-05-24 DIAGNOSIS — M9904 Segmental and somatic dysfunction of sacral region: Secondary | ICD-10-CM | POA: Diagnosis not present

## 2019-05-24 DIAGNOSIS — M9905 Segmental and somatic dysfunction of pelvic region: Secondary | ICD-10-CM | POA: Diagnosis not present

## 2019-05-24 DIAGNOSIS — M9903 Segmental and somatic dysfunction of lumbar region: Secondary | ICD-10-CM | POA: Diagnosis not present

## 2019-05-24 DIAGNOSIS — M545 Low back pain: Secondary | ICD-10-CM | POA: Diagnosis not present

## 2019-05-26 DIAGNOSIS — M9905 Segmental and somatic dysfunction of pelvic region: Secondary | ICD-10-CM | POA: Diagnosis not present

## 2019-05-26 DIAGNOSIS — M9904 Segmental and somatic dysfunction of sacral region: Secondary | ICD-10-CM | POA: Diagnosis not present

## 2019-05-26 DIAGNOSIS — Z3A35 35 weeks gestation of pregnancy: Secondary | ICD-10-CM | POA: Diagnosis not present

## 2019-05-26 DIAGNOSIS — O26613 Liver and biliary tract disorders in pregnancy, third trimester: Secondary | ICD-10-CM | POA: Diagnosis not present

## 2019-05-26 DIAGNOSIS — M545 Low back pain: Secondary | ICD-10-CM | POA: Diagnosis not present

## 2019-05-26 DIAGNOSIS — M9903 Segmental and somatic dysfunction of lumbar region: Secondary | ICD-10-CM | POA: Diagnosis not present

## 2019-05-27 DIAGNOSIS — M9905 Segmental and somatic dysfunction of pelvic region: Secondary | ICD-10-CM | POA: Diagnosis not present

## 2019-05-27 DIAGNOSIS — M9904 Segmental and somatic dysfunction of sacral region: Secondary | ICD-10-CM | POA: Diagnosis not present

## 2019-05-27 DIAGNOSIS — M545 Low back pain: Secondary | ICD-10-CM | POA: Diagnosis not present

## 2019-05-27 DIAGNOSIS — M9903 Segmental and somatic dysfunction of lumbar region: Secondary | ICD-10-CM | POA: Diagnosis not present

## 2019-05-29 ENCOUNTER — Ambulatory Visit (INDEPENDENT_AMBULATORY_CARE_PROVIDER_SITE_OTHER)
Admission: RE | Admit: 2019-05-29 | Discharge: 2019-05-29 | Disposition: A | Discharge status: Home / Self Care | Payer: BC Managed Care – PPO | Source: Ambulatory Visit

## 2019-05-29 DIAGNOSIS — Z3A36 36 weeks gestation of pregnancy: Secondary | ICD-10-CM | POA: Diagnosis not present

## 2019-05-29 DIAGNOSIS — H1031 Unspecified acute conjunctivitis, right eye: Secondary | ICD-10-CM

## 2019-05-29 MED ORDER — POLYMYXIN B-TRIMETHOPRIM 10000-0.1 UNIT/ML-% OP SOLN: 1.0000 [drp] | Freq: Four times a day (QID) | OPHTHALMIC | 0 refills | Status: DC

## 2019-05-29 NOTE — ED Provider Notes (Signed)
Virtual Visit via Video Note:  Sally Oliver  initiated request for Telemedicine visit with [facility] team. I connected with Sally Oliver  on 05/29/2019 at 11:19 AM  for a synchronized telemedicine visit using a video enabled HIPPA compliant telemedicine application. I verified that I am speaking with Sally Oliver  using two identifiers. Sally Hubble C Sally Gjerde, PA-C  was physically located in a [facility] Urgent care site and Sally Oliver was located at a different location.   The limitations of evaluation and management by telemedicine as well as the availability of in-person appointments were discussed. Patient was informed that she  may incur a bill ( including co-pay) for this virtual visit encounter. Sally Oliver  expressed understanding and gave verbal consent to proceed with virtual visit.     History of Present Illness:Sally Oliver  is a 29 y.o. female presents for evaluation of possible conjunctivitis.  Patient states that over the past 3 days she has had soreness around her right eye.  She has noticed that her eye has appeared more red inside her lids and she has had some crusting drainage especially in the mornings.  She denies contact use.  Denies changes in vision.  Denies any specific bump or swelling to eyelids.  Denies difficulty moving eyes.  Denies any associated URI symptoms.  Denies close contacts with similar.  Patient is [redacted] weeks pregnant.    No Known Allergies   Past Medical History:  Diagnosis Date  . Chicken pox   . Migraine headache   . Scoliosis      Social History   Tobacco Use  . Smoking status: Never Smoker  . Smokeless tobacco: Never Used  Substance Use Topics  . Alcohol use: Yes    Alcohol/week: 0.0 standard drinks    Comment: social-occasional  . Drug use: No        Observations/Objective: Physical Exam  Constitutional: She is oriented to person, place, and time and  well-developed, well-nourished, and in no distress. No distress.  HENT:  Head: Normocephalic and atraumatic.  Eyes: Pupils are equal, round, and reactive to light. EOM are normal.  Moving eyes in all directions, conjunctive a does appear slightly erythematous when patient everts lower lid, no surrounding erythema noted, no obvious discharge  Pulmonary/Chest: Effort normal. No respiratory distress.  Speaking in full sentences  Musculoskeletal:        General: Normal range of motion.     Comments: Moving all extremities appropriately  Neurological: She is alert and oriented to person, place, and time.  Speech clear, face symmetric     Assessment and Plan:    ICD-10-CM   1. Acute conjunctivitis of right eye, unspecified acute conjunctivitis type  H10.31     Patient likely with conjunctivitis, bacterial versus viral.  Given limitations of exam over video and patient pregnant we will go ahead and cover for bacterial with Polytrim.  No sign of stye or periorbital cellulitis.  Advised to continue to monitor and follow-up in person if developing worsening or persistent symptoms.  Discussed strict return precautions. Patient verbalized understanding and is agreeable with plan.    Follow Up Instructions:     I discussed the assessment and treatment plan with the patient. The patient was provided an opportunity to ask questions and all were answered. The patient agreed with the plan and demonstrated an understanding of the instructions.   The patient was advised to call back or seek an in-person evaluation  if the symptoms worsen or if the condition fails to improve as anticipated.      Sally Lima, PA-C  05/29/2019 11:19 AM         Sally Lima, PA-C 05/29/19 1119

## 2019-05-29 NOTE — Discharge Instructions (Addendum)
Viral Conjunctivitis- this is self-limiting and will improve on its own, may worsen for 3-5 days, but should resolve in 10-14 days  - Have Good Hand Hygiene  - Use Cold Compresses  - Use Polytrim eye drops (2 drops 4 times a day for 5-7 days)

## 2019-05-30 ENCOUNTER — Telehealth (HOSPITAL_COMMUNITY): Payer: Self-pay | Admitting: Emergency Medicine

## 2019-05-30 MED ORDER — POLYMYXIN B-TRIMETHOPRIM 10000-0.1 UNIT/ML-% OP SOLN: 1.0000 [drp] | Freq: Four times a day (QID) | OPHTHALMIC | 0 refills | Status: DC

## 2019-05-30 NOTE — Telephone Encounter (Signed)
rx not received by pharmacy from video visit yesterday

## 2019-05-31 DIAGNOSIS — M9903 Segmental and somatic dysfunction of lumbar region: Secondary | ICD-10-CM | POA: Diagnosis not present

## 2019-05-31 DIAGNOSIS — O26613 Liver and biliary tract disorders in pregnancy, third trimester: Secondary | ICD-10-CM | POA: Diagnosis not present

## 2019-05-31 DIAGNOSIS — M9905 Segmental and somatic dysfunction of pelvic region: Secondary | ICD-10-CM | POA: Diagnosis not present

## 2019-05-31 DIAGNOSIS — M9904 Segmental and somatic dysfunction of sacral region: Secondary | ICD-10-CM | POA: Diagnosis not present

## 2019-05-31 DIAGNOSIS — Z3A36 36 weeks gestation of pregnancy: Secondary | ICD-10-CM | POA: Diagnosis not present

## 2019-05-31 DIAGNOSIS — M545 Low back pain: Secondary | ICD-10-CM | POA: Diagnosis not present

## 2019-06-01 ENCOUNTER — Encounter (HOSPITAL_COMMUNITY): Payer: Self-pay | Admitting: *Deleted

## 2019-06-01 ENCOUNTER — Telehealth (HOSPITAL_COMMUNITY): Payer: Self-pay | Admitting: *Deleted

## 2019-06-01 ENCOUNTER — Other Ambulatory Visit: Payer: BC Managed Care – PPO

## 2019-06-01 NOTE — Telephone Encounter (Signed)
Preadmission screen  

## 2019-06-02 ENCOUNTER — Other Ambulatory Visit: Payer: Self-pay

## 2019-06-02 ENCOUNTER — Other Ambulatory Visit (HOSPITAL_COMMUNITY)
Admission: RE | Admit: 2019-06-02 | Discharge: 2019-06-02 | Disposition: A | Discharge status: Home / Self Care | Payer: BC Managed Care – PPO | Source: Ambulatory Visit | Attending: Obstetrics and Gynecology | Admitting: Obstetrics and Gynecology

## 2019-06-02 DIAGNOSIS — Z01812 Encounter for preprocedural laboratory examination: Secondary | ICD-10-CM | POA: Insufficient documentation

## 2019-06-02 DIAGNOSIS — Z20822 Contact with and (suspected) exposure to covid-19: Secondary | ICD-10-CM | POA: Diagnosis not present

## 2019-06-02 LAB — SARS CORONAVIRUS 2 (TAT 6-24 HRS): SARS Coronavirus 2: NEGATIVE

## 2019-06-04 ENCOUNTER — Encounter (HOSPITAL_COMMUNITY): Payer: Self-pay | Admitting: Obstetrics and Gynecology

## 2019-06-04 ENCOUNTER — Inpatient Hospital Stay (HOSPITAL_COMMUNITY)
Admission: AD | Admit: 2019-06-04 | Discharge: 2019-06-05 | DRG: 805 | Disposition: A | Discharge status: Home / Self Care | Payer: BC Managed Care – PPO | Attending: Obstetrics and Gynecology | Admitting: Obstetrics and Gynecology

## 2019-06-04 ENCOUNTER — Inpatient Hospital Stay (HOSPITAL_COMMUNITY): Payer: BC Managed Care – PPO

## 2019-06-04 ENCOUNTER — Inpatient Hospital Stay (HOSPITAL_COMMUNITY): Payer: BC Managed Care – PPO | Admitting: Anesthesiology

## 2019-06-04 DIAGNOSIS — O99824 Streptococcus B carrier state complicating childbirth: Secondary | ICD-10-CM | POA: Diagnosis present

## 2019-06-04 DIAGNOSIS — K831 Obstruction of bile duct: Secondary | ICD-10-CM | POA: Diagnosis present

## 2019-06-04 DIAGNOSIS — Z3A37 37 weeks gestation of pregnancy: Secondary | ICD-10-CM | POA: Diagnosis not present

## 2019-06-04 DIAGNOSIS — Z23 Encounter for immunization: Secondary | ICD-10-CM | POA: Diagnosis not present

## 2019-06-04 DIAGNOSIS — D225 Melanocytic nevi of trunk: Secondary | ICD-10-CM | POA: Diagnosis not present

## 2019-06-04 DIAGNOSIS — L299 Pruritus, unspecified: Secondary | ICD-10-CM | POA: Diagnosis not present

## 2019-06-04 DIAGNOSIS — O2662 Liver and biliary tract disorders in childbirth: Principal | ICD-10-CM | POA: Diagnosis present

## 2019-06-04 DIAGNOSIS — O26899 Other specified pregnancy related conditions, unspecified trimester: Secondary | ICD-10-CM

## 2019-06-04 DIAGNOSIS — O26893 Other specified pregnancy related conditions, third trimester: Secondary | ICD-10-CM | POA: Diagnosis present

## 2019-06-04 DIAGNOSIS — O9902 Anemia complicating childbirth: Secondary | ICD-10-CM | POA: Diagnosis present

## 2019-06-04 DIAGNOSIS — Z6791 Unspecified blood type, Rh negative: Secondary | ICD-10-CM | POA: Diagnosis not present

## 2019-06-04 DIAGNOSIS — Z3490 Encounter for supervision of normal pregnancy, unspecified, unspecified trimester: Secondary | ICD-10-CM | POA: Diagnosis present

## 2019-06-04 LAB — CBC
HCT: 33.2 % — ABNORMAL LOW (ref 36.0–46.0)
Hemoglobin: 10.6 g/dL — ABNORMAL LOW (ref 12.0–15.0)
MCH: 25.3 pg — ABNORMAL LOW (ref 26.0–34.0)
MCHC: 31.9 g/dL (ref 30.0–36.0)
MCV: 79.2 fL — ABNORMAL LOW (ref 80.0–100.0)
Platelets: 256 10*3/uL (ref 150–400)
RBC: 4.19 MIL/uL (ref 3.87–5.11)
RDW: 15.6 % — ABNORMAL HIGH (ref 11.5–15.5)
WBC: 11.5 10*3/uL — ABNORMAL HIGH (ref 4.0–10.5)
nRBC: 0.4 % — ABNORMAL HIGH (ref 0.0–0.2)

## 2019-06-04 LAB — RPR: RPR Ser Ql: NONREACTIVE

## 2019-06-04 MED ORDER — LIDOCAINE HCL (PF) 1 % IJ SOLN
INTRAMUSCULAR | Status: DC | PRN
  Administered 2019-06-04 (×2): 4 mL via EPIDURAL

## 2019-06-04 MED ORDER — OXYTOCIN 40 UNITS IN NORMAL SALINE INFUSION - SIMPLE MED
1.0000 m[IU]/min | INTRAVENOUS | Status: DC
  Administered 2019-06-04: 12:00:00 2 m[IU]/min via INTRAVENOUS

## 2019-06-04 MED ORDER — ZOLPIDEM TARTRATE 5 MG PO TABS: 5.0000 mg | ORAL_TABLET | Freq: Every evening | ORAL | Status: DC | PRN

## 2019-06-04 MED ORDER — SODIUM CHLORIDE 0.9% FLUSH: 3.0000 mL | INTRAVENOUS | Status: DC | PRN

## 2019-06-04 MED ORDER — ONDANSETRON HCL 4 MG/2ML IJ SOLN: 4.0000 mg | Freq: Four times a day (QID) | INTRAMUSCULAR | Status: DC | PRN

## 2019-06-04 MED ORDER — MISOPROSTOL 50MCG HALF TABLET
50.0000 ug | ORAL_TABLET | ORAL | Status: DC | PRN
  Administered 2019-06-04 (×2): 50 ug via ORAL
  Filled 2019-06-04 (×2): qty 1

## 2019-06-04 MED ORDER — SODIUM CHLORIDE 0.9 % IV SOLN: 250.0000 mL | INTRAVENOUS | Status: DC | PRN

## 2019-06-04 MED ORDER — SOD CITRATE-CITRIC ACID 500-334 MG/5ML PO SOLN: 30.0000 mL | ORAL | Status: DC | PRN

## 2019-06-04 MED ORDER — BENZOCAINE-MENTHOL 20-0.5 % EX AERO: 1.0000 "application " | INHALATION_SPRAY | CUTANEOUS | Status: DC | PRN

## 2019-06-04 MED ORDER — ACETAMINOPHEN 325 MG PO TABS: 650.0000 mg | ORAL_TABLET | ORAL | Status: DC | PRN

## 2019-06-04 MED ORDER — COCONUT OIL OIL: 1.0000 "application " | TOPICAL_OIL | Status: DC | PRN

## 2019-06-04 MED ORDER — FENTANYL-BUPIVACAINE-NACL 0.5-0.125-0.9 MG/250ML-% EP SOLN
12.0000 mL/h | EPIDURAL | Status: DC | PRN
  Filled 2019-06-04: qty 250

## 2019-06-04 MED ORDER — OXYTOCIN BOLUS FROM INFUSION
500.0000 mL | Freq: Once | INTRAVENOUS | Status: AC
  Administered 2019-06-04: 13:00:00 500 mL via INTRAVENOUS

## 2019-06-04 MED ORDER — SIMETHICONE 80 MG PO CHEW: 80.0000 mg | CHEWABLE_TABLET | ORAL | Status: DC | PRN

## 2019-06-04 MED ORDER — TERBUTALINE SULFATE 1 MG/ML IJ SOLN: 0.2500 mg | Freq: Once | INTRAMUSCULAR | Status: DC | PRN

## 2019-06-04 MED ORDER — TETANUS-DIPHTH-ACELL PERTUSSIS 5-2.5-18.5 LF-MCG/0.5 IM SUSP: 0.5000 mL | Freq: Once | INTRAMUSCULAR | Status: DC

## 2019-06-04 MED ORDER — BISACODYL 10 MG RE SUPP: 10.0000 mg | Freq: Every day | RECTAL | Status: DC | PRN

## 2019-06-04 MED ORDER — PRENATAL MULTIVITAMIN CH
1.0000 | ORAL_TABLET | Freq: Every day | ORAL | Status: DC
  Administered 2019-06-05: 12:00:00 1 via ORAL
  Filled 2019-06-04: qty 1

## 2019-06-04 MED ORDER — PHENYLEPHRINE 40 MCG/ML (10ML) SYRINGE FOR IV PUSH (FOR BLOOD PRESSURE SUPPORT)
80.0000 ug | PREFILLED_SYRINGE | INTRAVENOUS | Status: DC | PRN
  Administered 2019-06-04 (×2): 80 ug via INTRAVENOUS

## 2019-06-04 MED ORDER — DIBUCAINE (PERIANAL) 1 % EX OINT: 1.0000 "application " | TOPICAL_OINTMENT | CUTANEOUS | Status: DC | PRN

## 2019-06-04 MED ORDER — OXYTOCIN 40 UNITS IN NORMAL SALINE INFUSION - SIMPLE MED
2.5000 [IU]/h | INTRAVENOUS | Status: DC
  Filled 2019-06-04: qty 1000

## 2019-06-04 MED ORDER — LACTATED RINGERS IV SOLN: 500.0000 mL | Freq: Once | INTRAVENOUS | Status: DC

## 2019-06-04 MED ORDER — LIDOCAINE HCL (PF) 1 % IJ SOLN: 30.0000 mL | INTRAMUSCULAR | Status: DC | PRN

## 2019-06-04 MED ORDER — IBUPROFEN 600 MG PO TABS
600.0000 mg | ORAL_TABLET | Freq: Four times a day (QID) | ORAL | Status: DC
  Administered 2019-06-04 – 2019-06-05 (×4): 600 mg via ORAL
  Filled 2019-06-04 (×4): qty 1

## 2019-06-04 MED ORDER — SODIUM CHLORIDE 0.9% FLUSH: 3.0000 mL | Freq: Two times a day (BID) | INTRAVENOUS | Status: DC

## 2019-06-04 MED ORDER — EPHEDRINE 5 MG/ML INJ: 10.0000 mg | INTRAVENOUS | Status: DC | PRN

## 2019-06-04 MED ORDER — PENICILLIN G POT IN DEXTROSE 60000 UNIT/ML IV SOLN
3.0000 10*6.[IU] | INTRAVENOUS | Status: DC
  Administered 2019-06-04 (×2): 3 10*6.[IU] via INTRAVENOUS
  Filled 2019-06-04 (×2): qty 50

## 2019-06-04 MED ORDER — PHENYLEPHRINE 40 MCG/ML (10ML) SYRINGE FOR IV PUSH (FOR BLOOD PRESSURE SUPPORT)
80.0000 ug | PREFILLED_SYRINGE | INTRAVENOUS | Status: DC | PRN
  Filled 2019-06-04 (×2): qty 10

## 2019-06-04 MED ORDER — FLEET ENEMA 7-19 GM/118ML RE ENEM: 1.0000 | ENEMA | Freq: Every day | RECTAL | Status: DC | PRN

## 2019-06-04 MED ORDER — WITCH HAZEL-GLYCERIN EX PADS: 1.0000 "application " | MEDICATED_PAD | CUTANEOUS | Status: DC | PRN

## 2019-06-04 MED ORDER — NALBUPHINE HCL 10 MG/ML IJ SOLN
2.0000 mg | INTRAMUSCULAR | Status: DC | PRN
  Administered 2019-06-04: 12:00:00 2 mg via SUBCUTANEOUS
  Filled 2019-06-04: qty 1

## 2019-06-04 MED ORDER — ONDANSETRON HCL 4 MG PO TABS: 4.0000 mg | ORAL_TABLET | ORAL | Status: DC | PRN

## 2019-06-04 MED ORDER — SENNOSIDES-DOCUSATE SODIUM 8.6-50 MG PO TABS
2.0000 | ORAL_TABLET | ORAL | Status: DC
  Administered 2019-06-05: 01:00:00 2 via ORAL
  Filled 2019-06-04: qty 2

## 2019-06-04 MED ORDER — DIPHENHYDRAMINE HCL 50 MG/ML IJ SOLN: 12.5000 mg | INTRAMUSCULAR | Status: DC | PRN

## 2019-06-04 MED ORDER — ONDANSETRON HCL 4 MG/2ML IJ SOLN: 4.0000 mg | INTRAMUSCULAR | Status: DC | PRN

## 2019-06-04 MED ORDER — SODIUM CHLORIDE 0.9 % IV SOLN
5.0000 10*6.[IU] | Freq: Once | INTRAVENOUS | Status: AC
  Administered 2019-06-04: 01:00:00 5 10*6.[IU] via INTRAVENOUS
  Filled 2019-06-04: qty 5

## 2019-06-04 MED ORDER — DIPHENHYDRAMINE HCL 25 MG PO CAPS: 25.0000 mg | ORAL_CAPSULE | Freq: Four times a day (QID) | ORAL | Status: DC | PRN

## 2019-06-04 MED ORDER — SODIUM CHLORIDE (PF) 0.9 % IJ SOLN
INTRAMUSCULAR | Status: DC | PRN
  Administered 2019-06-04: 12 mL/h via EPIDURAL

## 2019-06-04 NOTE — Anesthesia Preprocedure Evaluation (Signed)
Anesthesia Evaluation  Patient identified by MRN, date of birth, ID band Patient awake    Reviewed: Allergy & Precautions, Patient's Chart, lab work & pertinent test results  History of Anesthesia Complications Negative for: history of anesthetic complications  Airway Mallampati: II  TM Distance: >3 FB Neck ROM: Full    Dental no notable dental hx.    Pulmonary neg pulmonary ROS,    Pulmonary exam normal        Cardiovascular negative cardio ROS Normal cardiovascular exam     Neuro/Psych  Headaches, negative psych ROS   GI/Hepatic negative GI ROS, Neg liver ROS,   Endo/Other  negative endocrine ROS  Renal/GU negative Renal ROS  negative genitourinary   Musculoskeletal negative musculoskeletal ROS (+)   Abdominal   Peds  Hematology negative hematology ROS (+)   Anesthesia Other Findings Day of surgery medications reviewed with patient.  Reproductive/Obstetrics (+) Pregnancy                             Anesthesia Physical Anesthesia Plan  ASA: II  Anesthesia Plan: Epidural   Post-op Pain Management:    Induction:   PONV Risk Score and Plan: Treatment may vary due to age or medical condition  Airway Management Planned: Natural Airway  Additional Equipment:   Intra-op Plan:   Post-operative Plan:   Informed Consent: I have reviewed the patients History and Physical, chart, labs and discussed the procedure including the risks, benefits and alternatives for the proposed anesthesia with the patient or authorized representative who has indicated his/her understanding and acceptance.       Plan Discussed with:   Anesthesia Plan Comments:         Anesthesia Quick Evaluation  

## 2019-06-04 NOTE — Lactation Note (Signed)
This note was copied from a baby's chart. Lactation Consultation Note  Patient Name: Sally Oliver [id number] Date: 06/04/2019  P2, 8 hour female, ETI infant. Infant had one void since birth. Per mom, infant has been latching well, infant has latched 4 times since  birth and most feedings are 15 to 30 minutes. Mom is experienced at breastfeeding, she breastfeed her 31 year old daughter for 16 months. Mom has medela DEBP at home. LC entered the room, mom was breastfeeding infant on her right breast using the cross cradle hold, infant was rthymitic  suckling, nose and chin touching breast,  swallows observed and infant breastfeed for 30 minutes. Infant appeared content afterwards, per mom, she only felt a  tug when infant was latch no pain. Mom was doing STS when [facility] left the room. Mom knows to breastfeed according to hunger cues, 8 to 12 times within 24 hours and on demand. Family will continue to do as much STS as possible. Mom knows how to hand express and give infant back volume. Reviewed Baby & Me book's Breastfeeding Basics.  Mom knows to call RN or LC if she has any questions, concerns or need assistance with latching infant at breast. Mom made aware of O/P services, breastfeeding support groups, community resources, and our phone # for post-discharge questions.     Maternal Data Formula Feeding for Exclusion: No Has patient been taught Hand Expression?: Yes Does the patient have breastfeeding experience prior to this delivery?: Yes  Feeding Feeding Type: Breast Fed  LATCH Score Latch: Grasps breast easily, tongue down, lips flanged, rhythmical sucking.  Audible Swallowing: Spontaneous and intermittent  Type of Nipple: Everted at rest and after stimulation  Comfort (Breast/Nipple): Soft / non-tender  Hold (Positioning): No assistance needed to correctly position infant at breast.  LATCH Score: 10  Interventions Interventions: Breast feeding basics  reviewed;Breast compression;Skin to skin;Breast massage;Position options;Hand express  Lactation Tools Discussed/Used WIC Program: No   Consult Status Consult Status: Follow-up Date: 06/05/19 Follow-up type: In-patient    Vicente Serene 06/04/2019, 8:41 PM

## 2019-06-04 NOTE — Anesthesia Procedure Notes (Signed)
Epidural Patient location during procedure: OB Start time: 06/04/2019 9:02 AM End time: 06/04/2019 9:05 AM  Staffing Anesthesiologist: Brennan Bailey, MD Performed: anesthesiologist   Preanesthetic Checklist Completed: patient identified, IV checked, risks and benefits discussed, monitors and equipment checked, pre-op evaluation and timeout performed  Epidural Patient position: sitting Prep: DuraPrep and site prepped and draped Patient monitoring: continuous pulse ox, blood pressure and heart rate Approach: midline Location: L3-L4 Injection technique: LOR air  Needle:  Needle type: Tuohy  Needle gauge: 17 G Needle length: 9 cm Needle insertion depth: 6 cm Catheter type: closed end flexible Catheter size: 19 Gauge Catheter at skin depth: 11 cm Test dose: negative and Other (1% lidocaine)  Assessment Events: blood not aspirated, injection not painful, no injection resistance, no paresthesia and negative IV test  Additional Notes Patient identified. Risks, benefits, and alternatives discussed with patient including but not limited to bleeding, infection, nerve damage, paralysis, failed block, incomplete pain control, headache, blood pressure changes, nausea, vomiting, reactions to medication, itching, and postpartum back pain. Confirmed with bedside nurse the patient's most recent platelet count. Confirmed with patient that they are not currently taking any anticoagulation, have any bleeding history, or any family history of bleeding disorders. Patient expressed understanding and wished to proceed. All questions were answered. Sterile technique was used throughout the entire procedure. Please see nursing notes for vital signs.   Crisp LOR on first pass. Test dose was given through epidural catheter and negative prior to continuing to dose epidural or start infusion. Warning signs of high block given to the patient including shortness of breath, tingling/numbness in hands, complete  motor block, or any concerning symptoms with instructions to call for help. Patient was given instructions on fall risk and not to get out of bed. All questions and concerns addressed with instructions to call with any issues or inadequate analgesia.  Reason for block:procedure for pain

## 2019-06-04 NOTE — H&P (Signed)
OB ADMISSION/ HISTORY & PHYSICAL:  Admission Date: 06/04/2019 12:04 AM  Admit Diagnosis: Encounter for planned induction of labor [id number]    Sally Oliver is a 30 y.o. female presenting for scheduled midnight IOL 2/2 ICP. Received 2 doses of oral Cytotec 50 mcg overnight, cervical balloon placed at 0730.  Patient requested epidural d/t sig back pain.  Prenatal History: G3P0010   EDC : 06/25/2019, by Other Basis  Prenatal care at [facility] since 1st trim, CNM care   Prenatal course complicated by: Pruritus suspicious of ICP since 23 weeks, slow elevation in bile acids over next 2 months, 12/31/202 - 12.5  Rh negative, Rhogam given 28 wks  Prenatal Labs: ABO, Rh: O (07/23 0000)  Antibody: POS (01/15 0101) Rubella: Immune (07/23 0000)  RPR: Nonreactive (07/23 0000)  HBsAg: Negative (07/23 0000)  HIV: Non-reactive (07/23 0000)  GBS: Positive/-- (12/31 0000)  1 hr Glucola : 95 Genetic Screening: declined Ultrasound: normal XX anatomy, anterior placenta Growth 36 wks EFW 7'10" (95%), AFI 15.9 ANFT normal  Pelvis proven 7'1"    Maternal Diabetes: No Genetic Screening: Declined Maternal Ultrasounds/Referrals: Normal Fetal Ultrasounds or other Referrals:  None Maternal Substance Abuse:  No Significant Maternal Medications:  Meds include: Other: Actigal, Pepcid, Zyrtex, Atarax Significant Maternal Lab Results:  Group B Strep positive Other Comments:  elevated bile acids and pruritus, ICP diagnosis since 23 weeks  Medical / Surgical History :  Past medical history:  Past Medical History:  Diagnosis Date  . Chicken pox   . Cholestasis during pregnancy   . History of endometriosis   . Migraine headache   . Scoliosis      Past surgical history:  Past Surgical History:  Procedure Laterality Date  . BREAST BIOPSY  2014   left  . BREAST LUMPECTOMY  2011   left  . WISDOM TOOTH EXTRACTION       Family History:  Family History  Problem  Relation Age of Onset  . Migraines Mother   . Heart disease Father   . Stroke Father   . Arthritis Father   . Healthy Maternal Grandmother   . Stroke Maternal Grandfather   . Diverticulitis Paternal Grandmother      Social History:  reports that she has never smoked. She has never used smokeless tobacco. She reports current alcohol use. She reports that she does not use drugs.   Allergies: Patient has no known allergies.   Current Medications at time of admission:  Medications Prior to Admission  Medication Sig Dispense Refill Last Dose  . trimethoprim-polymyxin b (POLYTRIM) ophthalmic solution Place 1 drop into the right eye every 6 (six) hours. 10 mL 0      Review of Systems: ROS Denies HA/NV/RUQ pain. + back pain prior to epidural.  Physical Exam: Vital signs and nursing notes reviewed.  ED Triage Vitals  Enc Vitals Group     BP 06/04/19 0009 119/79     Pulse Rate 06/04/19 0009 97     Resp 06/04/19 0009 17     Temp 06/04/19 0009 97.6 F (36.4 C)     Temp Source 06/04/19 0009 Oral     SpO2 06/04/19 0905 98 %     Weight 06/04/19 0015 151 lb 1.6 oz (68.5 kg)     Height 06/04/19 0015 5\' 2"  (1.575 m)     Head Circumference --      Peak Flow --      Pain Score 06/04/19 0159 2  Pain Loc --      Pain Edu? --      Excl. in [place]? --      General: AAO x 3, NAD Heart: RRR Lungs:CTAB Abdomen: Gravid, NT, Leopold's vertex, fetal spine to maternal L Extremities: no edema Genitalia / VE: Dilation: 2.5 Effacement (%): 60 Station: -2 Presentation: Vertex Exam by:: D. Eddie Dibbles, CNM   FHR: 130 BPM, mod variability, + accels, no decels TOCO: Ctx q2 min  Labs:   Pending T&S, CBC, RPR  Recent Labs    06/04/19 0101  WBC 11.5*  HGB 10.6*  HCT 33.2*  PLT 256       Assessment:  29 y.o. G3P0010 at [redacted]w[redacted]d  ICP on Actigal, for IOL Cervical ripening with CB and oral cytotec FHT category 1 GBS positive RH neg  Comfortable w/ epidural Will wait for cervical balloon  out then AROM if favorable Pitocin PRN GBS prophylaxis - PCN per protocol Rhogam PRN PP   Dr Ronita Hipps notified of admission / plan of care   [place], MSN 06/04/2019, 9:28 AM

## 2019-06-05 LAB — CBC
HCT: 32.7 % — ABNORMAL LOW (ref 36.0–46.0)
Hemoglobin: 10.6 g/dL — ABNORMAL LOW (ref 12.0–15.0)
MCH: 25.9 pg — ABNORMAL LOW (ref 26.0–34.0)
MCHC: 32.4 g/dL (ref 30.0–36.0)
MCV: 80 fL (ref 80.0–100.0)
Platelets: 292 10*3/uL (ref 150–400)
RBC: 4.09 MIL/uL (ref 3.87–5.11)
RDW: 16 % — ABNORMAL HIGH (ref 11.5–15.5)
WBC: 17.4 10*3/uL — ABNORMAL HIGH (ref 4.0–10.5)
nRBC: 0.1 % (ref 0.0–0.2)

## 2019-06-05 MED ORDER — IBUPROFEN 600 MG PO TABS: 600.0000 mg | ORAL_TABLET | Freq: Four times a day (QID) | ORAL | 0 refills | Status: DC

## 2019-06-05 MED ORDER — RHO D IMMUNE GLOBULIN 1500 UNIT/2ML IJ SOSY
300.0000 ug | PREFILLED_SYRINGE | Freq: Once | INTRAMUSCULAR | Status: AC
  Administered 2019-06-05: 14:00:00 300 ug via INTRAVENOUS
  Filled 2019-06-05: qty 2

## 2019-06-05 NOTE — Progress Notes (Signed)
No c/o, wants d/c home Normal lochia, no difficulty voiding, ibuprofen controls cramping pain with nursing No itching, feeling much better after delivery  Temp:  [97.8 F (36.6 C)-98.5 F (36.9 C)] 98.5 F (36.9 C) (01/16 0516) Pulse Rate:  [75-89] 85 (01/16 0516) Resp:  [12-18] 18 (01/16 0516) BP: (74-123)/(48-98) 107/76 (01/16 0516) SpO2:  [99 %] 99 % (01/15 1439)  A&ox3 Normal respirations Abd: soft, nt, nd, fundus firm and below umb 2cm LE: no edema, nt bilat  CBC Latest Ref Rng & Units 06/05/2019 06/04/2019 12/17/2017  WBC 4.0 - 10.5 K/uL 17.4(H) 11.5(H) 4.4  Hemoglobin 12.0 - 15.0 g/dL 10.6(L) 10.6(L) 13.9  Hematocrit 36.0 - 46.0 % 32.7(L) 33.2(L) 41.6  Platelets 150 - 400 K/uL 292 256 249.0    A/p: ppd 1 s/p svd 1. Doing well; d/c home and plan f/u in 6 wks 2. Cholestasis of pregnancy 3. Rh neg,  Rhogam prior to d/c  4. Rubella Immune

## 2019-06-05 NOTE — Anesthesia Postprocedure Evaluation (Signed)
Anesthesia Post Note  Patient: Sally Oliver  Procedure(s) Performed: AN AD [place]     Patient location during evaluation: Mother Baby Anesthesia Type: Epidural Level of consciousness: awake and alert Pain management: pain level controlled Vital Signs Assessment: post-procedure vital signs reviewed and stable Respiratory status: spontaneous breathing Cardiovascular status: stable Postop Assessment: no headache, adequate PO intake, no backache, patient able to bend at knees, able to ambulate, epidural receding and no apparent nausea or vomiting Anesthetic complications: no    Last Vitals:  Vitals:   06/05/19 0023 06/05/19 0516  BP: 110/70 107/76  Pulse: 77 85  Resp: 18 18  Temp: 36.7 C 36.9 C  SpO2:      Last Pain:  Vitals:   06/05/19 0516  TempSrc: Oral  PainSc: 0-No pain   Pain Goal:                   Sally Oliver

## 2019-06-05 NOTE — Lactation Note (Signed)
This note was copied from a baby's chart. Lactation Consultation Note  Patient Name: Girl Haniah Bellizzi [id number] Date: 06/05/2019 Reason for consult: Follow-up assessment Baby is 24 hours old/3% weight loss.  Mom reports that breastfeeding is going very well.  Reviewed possible increased sleepiness at 37 weeks.  Encouraged skin to skin with feedings and waking by third hour.  Discussed prevention and treatment of engorgement.  She has a pump at home.  Reviewed outpatient services and encouraged to call prn.  Maternal Data    Feeding Feeding Type: Breast Fed  LATCH Score                   Interventions    Lactation Tools Discussed/Used     Consult Status Consult Status: Complete Follow-up type: Call as needed    Ave Filter 06/05/2019, 1:31 PM

## 2019-06-05 NOTE — Discharge Summary (Signed)
Obstetric Discharge Summary Reason for Admission: induction of labor Prenatal Procedures: none Intrapartum Procedures: spontaneous vaginal delivery Postpartum Procedures: none Complications-Operative and Postpartum: none Hemoglobin  Date Value Ref Range Status  06/05/2019 10.6 (L) 12.0 - 15.0 g/dL Final   HCT  Date Value Ref Range Status  06/05/2019 32.7 (L) 36.0 - 46.0 % Final    Physical Exam:  General: alert, cooperative and no distress Lochia: appropriate Uterine Fundus: firm Incision: n/a DVT Evaluation: No evidence of DVT seen on physical exam.  Discharge Diagnoses: Term Pregnancy-delivered  Discharge Information: Date: 06/05/2019 Activity: pelvic rest Diet: routine Medications: Ibuprofen Condition: stable Instructions: refer to practice specific booklet Discharge to: home Follow-up Information    Juliene Pina, CNM Follow up.   Specialty: Obstetrics and Gynecology Contact information: [place] [employer] [address] [phone]           Newborn Data: Live born female  Birth Weight: 6 lb 13.3 oz (3099 g) APGAR: 42, 61  Newborn Delivery   Birth date/time: 06/04/2019 12:38:00 Delivery type: Vaginal, Spontaneous      Home with mother.  Charyl Bigger 06/05/2019, 12:46 PM

## 2019-06-06 LAB — RH IG WORKUP (INCLUDES ABO/RH)
ABO/RH(D): O NEG
Fetal Screen: NEGATIVE
Gestational Age(Wks): 37
Unit division: 0

## 2019-06-07 LAB — BPAM RBC
Blood Product Expiration Date: 202102222359
Blood Product Expiration Date: 202102222359
Unit Type and Rh: 9500
Unit Type and Rh: 9500

## 2019-06-07 LAB — TYPE AND SCREEN
ABO/RH(D): O NEG
Antibody Screen: POSITIVE
Unit division: 0
Unit division: 0

## 2019-06-08 LAB — SURGICAL PATHOLOGY

## 2019-06-14 DIAGNOSIS — M545 Low back pain: Secondary | ICD-10-CM | POA: Diagnosis not present

## 2019-06-14 DIAGNOSIS — M9903 Segmental and somatic dysfunction of lumbar region: Secondary | ICD-10-CM | POA: Diagnosis not present

## 2019-06-14 DIAGNOSIS — M9905 Segmental and somatic dysfunction of pelvic region: Secondary | ICD-10-CM | POA: Diagnosis not present

## 2019-06-14 DIAGNOSIS — M9904 Segmental and somatic dysfunction of sacral region: Secondary | ICD-10-CM | POA: Diagnosis not present

## 2019-06-21 DIAGNOSIS — M9905 Segmental and somatic dysfunction of pelvic region: Secondary | ICD-10-CM | POA: Diagnosis not present

## 2019-06-21 DIAGNOSIS — M9904 Segmental and somatic dysfunction of sacral region: Secondary | ICD-10-CM | POA: Diagnosis not present

## 2019-06-21 DIAGNOSIS — M9903 Segmental and somatic dysfunction of lumbar region: Secondary | ICD-10-CM | POA: Diagnosis not present

## 2019-06-21 DIAGNOSIS — M545 Low back pain: Secondary | ICD-10-CM | POA: Diagnosis not present

## 2019-06-24 DIAGNOSIS — M9903 Segmental and somatic dysfunction of lumbar region: Secondary | ICD-10-CM | POA: Diagnosis not present

## 2019-06-24 DIAGNOSIS — M9905 Segmental and somatic dysfunction of pelvic region: Secondary | ICD-10-CM | POA: Diagnosis not present

## 2019-06-24 DIAGNOSIS — M545 Low back pain: Secondary | ICD-10-CM | POA: Diagnosis not present

## 2019-06-24 DIAGNOSIS — M9904 Segmental and somatic dysfunction of sacral region: Secondary | ICD-10-CM | POA: Diagnosis not present

## 2019-06-28 DIAGNOSIS — M9905 Segmental and somatic dysfunction of pelvic region: Secondary | ICD-10-CM | POA: Diagnosis not present

## 2019-06-28 DIAGNOSIS — M545 Low back pain: Secondary | ICD-10-CM | POA: Diagnosis not present

## 2019-06-28 DIAGNOSIS — M9903 Segmental and somatic dysfunction of lumbar region: Secondary | ICD-10-CM | POA: Diagnosis not present

## 2019-06-28 DIAGNOSIS — M9904 Segmental and somatic dysfunction of sacral region: Secondary | ICD-10-CM | POA: Diagnosis not present

## 2019-07-01 DIAGNOSIS — M9905 Segmental and somatic dysfunction of pelvic region: Secondary | ICD-10-CM | POA: Diagnosis not present

## 2019-07-01 DIAGNOSIS — M4126 Other idiopathic scoliosis, lumbar region: Secondary | ICD-10-CM | POA: Diagnosis not present

## 2019-07-01 DIAGNOSIS — M9903 Segmental and somatic dysfunction of lumbar region: Secondary | ICD-10-CM | POA: Diagnosis not present

## 2019-07-01 DIAGNOSIS — M9904 Segmental and somatic dysfunction of sacral region: Secondary | ICD-10-CM | POA: Diagnosis not present

## 2019-07-05 DIAGNOSIS — M4126 Other idiopathic scoliosis, lumbar region: Secondary | ICD-10-CM | POA: Diagnosis not present

## 2019-07-05 DIAGNOSIS — M9903 Segmental and somatic dysfunction of lumbar region: Secondary | ICD-10-CM | POA: Diagnosis not present

## 2019-07-05 DIAGNOSIS — M9905 Segmental and somatic dysfunction of pelvic region: Secondary | ICD-10-CM | POA: Diagnosis not present

## 2019-07-05 DIAGNOSIS — M9904 Segmental and somatic dysfunction of sacral region: Secondary | ICD-10-CM | POA: Diagnosis not present

## 2019-07-08 DIAGNOSIS — M9904 Segmental and somatic dysfunction of sacral region: Secondary | ICD-10-CM | POA: Diagnosis not present

## 2019-07-08 DIAGNOSIS — M9905 Segmental and somatic dysfunction of pelvic region: Secondary | ICD-10-CM | POA: Diagnosis not present

## 2019-07-08 DIAGNOSIS — M4126 Other idiopathic scoliosis, lumbar region: Secondary | ICD-10-CM | POA: Diagnosis not present

## 2019-07-08 DIAGNOSIS — M9903 Segmental and somatic dysfunction of lumbar region: Secondary | ICD-10-CM | POA: Diagnosis not present

## 2019-07-12 DIAGNOSIS — M9904 Segmental and somatic dysfunction of sacral region: Secondary | ICD-10-CM | POA: Diagnosis not present

## 2019-07-12 DIAGNOSIS — M9903 Segmental and somatic dysfunction of lumbar region: Secondary | ICD-10-CM | POA: Diagnosis not present

## 2019-07-12 DIAGNOSIS — M9905 Segmental and somatic dysfunction of pelvic region: Secondary | ICD-10-CM | POA: Diagnosis not present

## 2019-07-12 DIAGNOSIS — M4126 Other idiopathic scoliosis, lumbar region: Secondary | ICD-10-CM | POA: Diagnosis not present

## 2019-07-15 DIAGNOSIS — M4126 Other idiopathic scoliosis, lumbar region: Secondary | ICD-10-CM | POA: Diagnosis not present

## 2019-07-15 DIAGNOSIS — M9903 Segmental and somatic dysfunction of lumbar region: Secondary | ICD-10-CM | POA: Diagnosis not present

## 2019-07-15 DIAGNOSIS — M9905 Segmental and somatic dysfunction of pelvic region: Secondary | ICD-10-CM | POA: Diagnosis not present

## 2019-07-15 DIAGNOSIS — M9904 Segmental and somatic dysfunction of sacral region: Secondary | ICD-10-CM | POA: Diagnosis not present

## 2019-07-19 DIAGNOSIS — M9905 Segmental and somatic dysfunction of pelvic region: Secondary | ICD-10-CM | POA: Diagnosis not present

## 2019-07-19 DIAGNOSIS — M9904 Segmental and somatic dysfunction of sacral region: Secondary | ICD-10-CM | POA: Diagnosis not present

## 2019-07-19 DIAGNOSIS — M4126 Other idiopathic scoliosis, lumbar region: Secondary | ICD-10-CM | POA: Diagnosis not present

## 2019-07-19 DIAGNOSIS — Z30430 Encounter for insertion of intrauterine contraceptive device: Secondary | ICD-10-CM | POA: Diagnosis not present

## 2019-07-19 DIAGNOSIS — M9903 Segmental and somatic dysfunction of lumbar region: Secondary | ICD-10-CM | POA: Diagnosis not present

## 2019-07-22 DIAGNOSIS — M9903 Segmental and somatic dysfunction of lumbar region: Secondary | ICD-10-CM | POA: Diagnosis not present

## 2019-07-22 DIAGNOSIS — M4126 Other idiopathic scoliosis, lumbar region: Secondary | ICD-10-CM | POA: Diagnosis not present

## 2019-07-22 DIAGNOSIS — M9905 Segmental and somatic dysfunction of pelvic region: Secondary | ICD-10-CM | POA: Diagnosis not present

## 2019-07-22 DIAGNOSIS — M9904 Segmental and somatic dysfunction of sacral region: Secondary | ICD-10-CM | POA: Diagnosis not present

## 2019-07-26 DIAGNOSIS — M4126 Other idiopathic scoliosis, lumbar region: Secondary | ICD-10-CM | POA: Diagnosis not present

## 2019-07-26 DIAGNOSIS — M9903 Segmental and somatic dysfunction of lumbar region: Secondary | ICD-10-CM | POA: Diagnosis not present

## 2019-07-26 DIAGNOSIS — M9904 Segmental and somatic dysfunction of sacral region: Secondary | ICD-10-CM | POA: Diagnosis not present

## 2019-07-26 DIAGNOSIS — M9905 Segmental and somatic dysfunction of pelvic region: Secondary | ICD-10-CM | POA: Diagnosis not present

## 2019-07-29 DIAGNOSIS — M9905 Segmental and somatic dysfunction of pelvic region: Secondary | ICD-10-CM | POA: Diagnosis not present

## 2019-07-29 DIAGNOSIS — M9903 Segmental and somatic dysfunction of lumbar region: Secondary | ICD-10-CM | POA: Diagnosis not present

## 2019-07-29 DIAGNOSIS — M4126 Other idiopathic scoliosis, lumbar region: Secondary | ICD-10-CM | POA: Diagnosis not present

## 2019-07-29 DIAGNOSIS — M62838 Other muscle spasm: Secondary | ICD-10-CM | POA: Diagnosis not present

## 2019-07-29 DIAGNOSIS — M9904 Segmental and somatic dysfunction of sacral region: Secondary | ICD-10-CM | POA: Diagnosis not present

## 2019-07-29 DIAGNOSIS — N393 Stress incontinence (female) (male): Secondary | ICD-10-CM | POA: Diagnosis not present

## 2019-07-29 DIAGNOSIS — N3941 Urge incontinence: Secondary | ICD-10-CM | POA: Diagnosis not present

## 2019-07-29 DIAGNOSIS — M6281 Muscle weakness (generalized): Secondary | ICD-10-CM | POA: Diagnosis not present

## 2019-08-02 DIAGNOSIS — M9904 Segmental and somatic dysfunction of sacral region: Secondary | ICD-10-CM | POA: Diagnosis not present

## 2019-08-02 DIAGNOSIS — M9905 Segmental and somatic dysfunction of pelvic region: Secondary | ICD-10-CM | POA: Diagnosis not present

## 2019-08-02 DIAGNOSIS — N393 Stress incontinence (female) (male): Secondary | ICD-10-CM | POA: Diagnosis not present

## 2019-08-02 DIAGNOSIS — N3941 Urge incontinence: Secondary | ICD-10-CM | POA: Diagnosis not present

## 2019-08-02 DIAGNOSIS — M6281 Muscle weakness (generalized): Secondary | ICD-10-CM | POA: Diagnosis not present

## 2019-08-02 DIAGNOSIS — M9903 Segmental and somatic dysfunction of lumbar region: Secondary | ICD-10-CM | POA: Diagnosis not present

## 2019-08-02 DIAGNOSIS — M62838 Other muscle spasm: Secondary | ICD-10-CM | POA: Diagnosis not present

## 2019-08-02 DIAGNOSIS — M4126 Other idiopathic scoliosis, lumbar region: Secondary | ICD-10-CM | POA: Diagnosis not present

## 2019-08-09 DIAGNOSIS — N3941 Urge incontinence: Secondary | ICD-10-CM | POA: Diagnosis not present

## 2019-08-09 DIAGNOSIS — M9905 Segmental and somatic dysfunction of pelvic region: Secondary | ICD-10-CM | POA: Diagnosis not present

## 2019-08-09 DIAGNOSIS — M4126 Other idiopathic scoliosis, lumbar region: Secondary | ICD-10-CM | POA: Diagnosis not present

## 2019-08-09 DIAGNOSIS — M9903 Segmental and somatic dysfunction of lumbar region: Secondary | ICD-10-CM | POA: Diagnosis not present

## 2019-08-09 DIAGNOSIS — M9904 Segmental and somatic dysfunction of sacral region: Secondary | ICD-10-CM | POA: Diagnosis not present

## 2019-08-09 DIAGNOSIS — N393 Stress incontinence (female) (male): Secondary | ICD-10-CM | POA: Diagnosis not present

## 2019-08-09 DIAGNOSIS — M62838 Other muscle spasm: Secondary | ICD-10-CM | POA: Diagnosis not present

## 2019-08-09 DIAGNOSIS — M6281 Muscle weakness (generalized): Secondary | ICD-10-CM | POA: Diagnosis not present

## 2019-08-16 DIAGNOSIS — M6281 Muscle weakness (generalized): Secondary | ICD-10-CM | POA: Diagnosis not present

## 2019-08-16 DIAGNOSIS — N3941 Urge incontinence: Secondary | ICD-10-CM | POA: Diagnosis not present

## 2019-08-16 DIAGNOSIS — M62838 Other muscle spasm: Secondary | ICD-10-CM | POA: Diagnosis not present

## 2019-08-16 DIAGNOSIS — N393 Stress incontinence (female) (male): Secondary | ICD-10-CM | POA: Diagnosis not present

## 2019-08-23 DIAGNOSIS — N393 Stress incontinence (female) (male): Secondary | ICD-10-CM | POA: Diagnosis not present

## 2019-08-23 DIAGNOSIS — Z30431 Encounter for routine checking of intrauterine contraceptive device: Secondary | ICD-10-CM | POA: Diagnosis not present

## 2019-08-23 DIAGNOSIS — M6281 Muscle weakness (generalized): Secondary | ICD-10-CM | POA: Diagnosis not present

## 2019-08-23 DIAGNOSIS — N3941 Urge incontinence: Secondary | ICD-10-CM | POA: Diagnosis not present

## 2019-08-23 DIAGNOSIS — M62838 Other muscle spasm: Secondary | ICD-10-CM | POA: Diagnosis not present

## 2019-08-30 DIAGNOSIS — N393 Stress incontinence (female) (male): Secondary | ICD-10-CM | POA: Diagnosis not present

## 2019-08-30 DIAGNOSIS — M62838 Other muscle spasm: Secondary | ICD-10-CM | POA: Diagnosis not present

## 2019-08-30 DIAGNOSIS — N3941 Urge incontinence: Secondary | ICD-10-CM | POA: Diagnosis not present

## 2019-08-30 DIAGNOSIS — M6281 Muscle weakness (generalized): Secondary | ICD-10-CM | POA: Diagnosis not present

## 2019-09-16 ENCOUNTER — Ambulatory Visit: Payer: BC Managed Care – PPO

## 2019-10-07 ENCOUNTER — Ambulatory Visit: Payer: BC Managed Care – PPO | Attending: Internal Medicine

## 2019-10-07 DIAGNOSIS — Z23 Encounter for immunization: Secondary | ICD-10-CM

## 2019-10-07 NOTE — Progress Notes (Signed)
   Covid-19 Vaccination Clinic  Name:  Sally Oliver    MRN: [id number] DOB: Jan 10, 1991  10/07/2019  Ms. [name] was observed post Covid-19 immunization for 15 minutes without incident. She was provided with Vaccine Information Sheet and instruction to access the V-Safe system.   Ms. [name] was instructed to call 911 with any severe reactions post vaccine: Marland Kitchen Difficulty breathing  . Swelling of face and throat  . A fast heartbeat  . A bad rash all over body  . Dizziness and weakness   Immunizations Administered    Name Date Dose VIS Date Route   Pfizer COVID-19 Vaccine 10/07/2019  9:04 AM 0.3 mL 07/14/2018 Intramuscular   Manufacturer: [place]   Lot: [id number]   [place]: [id number]

## 2019-10-25 DIAGNOSIS — D485 Neoplasm of uncertain behavior of skin: Secondary | ICD-10-CM | POA: Diagnosis not present

## 2019-10-25 DIAGNOSIS — D2271 Melanocytic nevi of right lower limb, including hip: Secondary | ICD-10-CM | POA: Diagnosis not present

## 2019-10-25 DIAGNOSIS — L723 Sebaceous cyst: Secondary | ICD-10-CM | POA: Diagnosis not present

## 2019-10-25 DIAGNOSIS — L905 Scar conditions and fibrosis of skin: Secondary | ICD-10-CM | POA: Diagnosis not present

## 2019-10-25 DIAGNOSIS — L918 Other hypertrophic disorders of the skin: Secondary | ICD-10-CM | POA: Diagnosis not present

## 2019-10-25 DIAGNOSIS — D224 Melanocytic nevi of scalp and neck: Secondary | ICD-10-CM | POA: Diagnosis not present

## 2019-10-25 DIAGNOSIS — D225 Melanocytic nevi of trunk: Secondary | ICD-10-CM | POA: Diagnosis not present

## 2019-11-01 ENCOUNTER — Ambulatory Visit: Payer: BC Managed Care – PPO | Attending: Internal Medicine

## 2019-11-01 DIAGNOSIS — Z23 Encounter for immunization: Secondary | ICD-10-CM

## 2019-11-01 NOTE — Progress Notes (Signed)
   Covid-19 Vaccination Clinic  Name:  Tranice Laduke    MRN: [id number] DOB: 09/26/1990  11/01/2019  Ms. [name] was observed post Covid-19 immunization for 15 minutes without incident. She was provided with Vaccine Information Sheet and instruction to access the V-Safe system.   Ms. Rayanne Padmanabhan was instructed to call 911 with any severe reactions post vaccine: Marland Kitchen Difficulty breathing  . Swelling of face and throat  . A fast heartbeat  . A bad rash all over body  . Dizziness and weakness   Immunizations Administered    Name Date Dose VIS Date Route   Pfizer COVID-19 Vaccine 11/01/2019 11:07 AM 0.3 mL 07/14/2018 Intramuscular   Manufacturer: [employer]   Lot: [id number]   [place]: [address]-7

## 2019-11-24 ENCOUNTER — Telehealth (INDEPENDENT_AMBULATORY_CARE_PROVIDER_SITE_OTHER): Payer: BC Managed Care – PPO | Admitting: Family Medicine

## 2019-11-24 ENCOUNTER — Other Ambulatory Visit: Payer: Self-pay

## 2019-11-24 ENCOUNTER — Encounter: Payer: Self-pay | Admitting: Family Medicine

## 2019-11-24 DIAGNOSIS — J069 Acute upper respiratory infection, unspecified: Secondary | ICD-10-CM

## 2019-11-24 DIAGNOSIS — J040 Acute laryngitis: Secondary | ICD-10-CM | POA: Diagnosis not present

## 2019-11-24 NOTE — Progress Notes (Signed)
Patient: Sally Oliver MRN: [id number] DOB: [birth year] PCP: Orma Flaming, MD     I connected with Lauree Chandler on 11/24/19 at 1:07pm by a video enabled telemedicine application and verified that I am speaking with the correct person using two identifiers.  Location patient: Home Location provider: [facility], Office Persons participating in this virtual visit: Brynlie Daza and Dr. Rogers Blocker   I discussed the limitations of evaluation and management by telemedicine and the availability of in person appointments. The patient expressed understanding and agreed to proceed.   Subjective:  Chief Complaint  Patient presents with   Upper respiratory concerns    Started off as a sinus infection that hasnt gotten any better. She has taken some otc Sudafed. Yesterday she had some sinus pressure.     HPI: The patient is a 29 y.o. female who presents today for URI symptoms. She just came back from the beach. She states on Sunday night she has really bad sinus pain and pressure and had to put ice pack on them. She states this is better, but now is having drainage and cough. She has also lost her voice. She denies any fever/chills. She has taken tylenol and sudafed. She no longer has pain in her sinuses and just feels like it's down in her throat. She states her cough is productive in nature. No shortness of breath or wheezing at all. She is eating and drinking fine. She is breast feeding.   Review of Systems  Constitutional: Negative for chills, diaphoresis and fever.  HENT: Positive for congestion, postnasal drip, rhinorrhea, sneezing and voice change. Negative for ear discharge, ear pain, sinus pressure, sinus pain and sore throat.   Respiratory: Positive for cough. Negative for chest tightness, shortness of breath and wheezing.   Gastrointestinal: Negative for abdominal pain, nausea and vomiting.    Allergies Patient has No Known Allergies.  Past Medical History Patient   has a past medical history of Chicken pox, Cholestasis during pregnancy, History of endometriosis, Migraine headache, and Scoliosis.  Surgical History Patient  has a past surgical history that includes Wisdom tooth extraction; Breast lumpectomy (2011); and Breast biopsy (2014).  Family History Pateint's family history includes Arthritis in her father; Diverticulitis in her paternal grandmother; Healthy in her maternal grandmother; Heart disease in her father; Migraines in her mother; Stroke in her father and maternal grandfather.  Social History Patient  reports that she has never smoked. She has never used smokeless tobacco. She reports current alcohol use. She reports that she does not use drugs.    Objective: There were no vitals filed for this visit.  There is no height or weight on file to calculate BMI.  Physical Exam Vitals reviewed.  Constitutional:      Appearance: Normal appearance.     Comments: Slightly hoarse   HENT:     Head: Normocephalic and atraumatic.     Comments: No TTP over her sinuses when she self palpates  Eyes:     Extraocular Movements: Extraocular movements intact.     Pupils: Pupils are equal, round, and reactive to light.  Pulmonary:     Effort: Pulmonary effort is normal.     Breath sounds: No stridor.  Neurological:     General: No focal deficit present.     Mental Status: She is alert and oriented to person, place, and time.  Psychiatric:        Mood and Affect: Mood normal.        Behavior: Behavior  normal.       Assessment/plan: 1. Viral URI Appears to be viral with no indication for abx at this time. Conservative therapy with flonase, cool mist humidifier, honey and can try zyrtec, but discussed trhis may decrease her milk supply. Can use sudafed in very limited amounts with breast feeding, but may drop her milk as well. If not getting better, worsening symptoms, fever, etc. She is to let me know so I can send in abx. precautions given.    2. Laryngitis Conservative therapy with vocal rest, cool mist humidifier and flonase to help post nasal drip.     Return if symptoms worsen or fail to improve.    Orma Flaming, MD [place]  11/24/2019

## 2019-11-24 NOTE — Patient Instructions (Signed)
1) cool mist humidifier at night 2) vocal rest (impossible with kids I know) 3) honey  4) flonase  5) sudafed short term with breast feeding 6) could consider zyrtec, may decrease milk flow as can sudafed.  7) please call or email if not improving or worsening symptoms and will send in antibiotic.  Viral Respiratory Infection A viral respiratory infection is an illness that affects parts of the body that are used for breathing. These include the lungs, nose, and throat. It is caused by a germ called a virus. Some examples of this kind of infection are:  A cold.  The flu (influenza).  A respiratory syncytial virus (RSV) infection. A person who gets this illness may have the following symptoms:  A stuffy or runny nose.  Yellow or green fluid in the nose.  A cough.  Sneezing.  Tiredness (fatigue).  Achy muscles.  A sore throat.  Sweating or chills.  A fever.  A headache. Follow these instructions at home: Managing pain and congestion  Take over-the-counter and prescription medicines only as told by your doctor.  If you have a sore throat, gargle with salt water. Do this 3-4 times per day or as needed. To make a salt-water mixture, dissolve -1 tsp of salt in 1 cup of warm water. Make sure that all the salt dissolves.  Use nose drops made from salt water. This helps with stuffiness (congestion). It also helps soften the skin around your nose.  Drink enough fluid to keep your pee (urine) pale yellow. General instructions   Rest as much as possible.  Do not drink alcohol.  Do not use any products that have nicotine or tobacco, such as cigarettes and e-cigarettes. If you need help quitting, ask your doctor.  Keep all follow-up visits as told by your doctor. This is important. How is this prevented?   Get a flu shot every year. Ask your doctor when you should get your flu shot.  Do not let other people get your germs. If you are sick: ? Stay home from work or  school. ? Wash your hands with soap and water often. Wash your hands after you cough or sneeze. If soap and water are not available, use hand sanitizer.  Avoid contact with people who are sick during cold and flu season. This is in fall and winter. Get help if:  Your symptoms last for 10 days or longer.  Your symptoms get worse over time.  You have a fever.  You have very bad pain in your face or forehead.  Parts of your jaw or neck become very swollen. Get help right away if:  You feel pain or pressure in your chest.  You have shortness of breath.  You faint or feel like you will faint.  You keep throwing up (vomiting).  You feel confused. Summary  A viral respiratory infection is an illness that affects parts of the body that are used for breathing.  Examples of this illness include a cold, the flu, and respiratory syncytial virus (RSV) infection.  The infection can cause a runny nose, cough, sneezing, sore throat, and fever.  Follow what your doctor tells you about taking medicines, drinking lots of fluid, washing your hands, resting at home, and avoiding people who are sick. This information is not intended to replace advice given to you by your health care provider. Make sure you discuss any questions you have with your health care provider. Document Revised: 05/14/2018 Document Reviewed: 06/16/2017 Elsevier Patient Education  [place].   Laryngitis Laryngitis is irritation and swelling (inflammation) of your vocal cords. This condition causes symptoms such as:  A change in your voice. It may sound low and hoarse.  Loss of voice.  Coughing.  Sore throat.  Dry throat.  Stuffy nose. Depending on the cause, this condition may go away after a short time or may last for more than 3 weeks. Treatment often involves resting your voice and using medicines to soothe your throat. Follow these instructions at home: Medicines  Take over-the-counter and  prescription medicines only as told by your doctor.  If you were prescribed an antibiotic medicine, take it as told by your doctor. Do not stop taking it even if you start to feel better. General instructions  Talk as little as possible. Also avoid whispering.  Write instead of talking. Do this until your voice is back to normal.  Drink enough fluid to keep your pee (urine) pale yellow.  Breathe in moist air. Use a humidifier if you live in a dry climate.  Do not use any products that have nicotine or tobacco in them, such as cigarettes and e-cigarettes. If you need help quitting, ask your doctor. Contact a doctor if:  You have a fever.  Your pain is worse.  Your symptoms do not get better in 2 weeks. Get help right away if:  You cough up blood.  You have trouble swallowing.  You have trouble breathing. Summary  Laryngitis is inflammation of your vocal cords.  This condition causes your voice to sound low and hoarse.  Rest your voice by talking as little as possible. Also avoid whispering. This information is not intended to replace advice given to you by your health care provider. Make sure you discuss any questions you have with your health care provider. Document Revised: 04/23/2017 Document Reviewed: 04/23/2017 Elsevier Patient Education  2020 [employer].

## 2020-01-04 DIAGNOSIS — Z6822 Body mass index (BMI) 22.0-22.9, adult: Secondary | ICD-10-CM | POA: Diagnosis not present

## 2020-01-04 DIAGNOSIS — Z01419 Encounter for gynecological examination (general) (routine) without abnormal findings: Secondary | ICD-10-CM | POA: Diagnosis not present

## 2020-08-21 ENCOUNTER — Telehealth: Payer: BC Managed Care – PPO | Admitting: Emergency Medicine

## 2020-08-21 DIAGNOSIS — R112 Nausea with vomiting, unspecified: Secondary | ICD-10-CM

## 2020-08-21 DIAGNOSIS — Z3200 Encounter for pregnancy test, result unknown: Secondary | ICD-10-CM | POA: Diagnosis not present

## 2020-08-21 NOTE — Progress Notes (Signed)
Hi Bee,  Based on what you shared with me, with at least 3 weeks of nausea and bloating that has now worsened with vomiting, I feel your condition warrants further evaluation and I recommend that you be seen for a face to face office visit where an abdominal exam and labs can be performed to help determine the cause of your symptoms to help better determine a treatment plan for you.   NOTE: If you entered your credit card information for this eVisit, you will not be charged. You may see a "hold" on your card for the $35 but that hold will drop off and you will not have a charge processed.   If you are having a true medical emergency please call 911.      For an urgent face to face visit, [facility] has five urgent care centers for your convenience:     [facility] [place] Get Driving Directions [phone] [place], [place] [address] . 10 am - 6pm Monday - Friday    [facility]) Get Driving Directions [phone] [address] [place], [place] [address] . 10 am to 8 pm Monday-Friday . 12 pm to 8 pm [facility] at [facility] Get Driving Directions [phone] [place], [place], [place] [address] . 8 am to 8 pm Monday-Friday . 9 am to 6 pm Saturday . 11 am to 6 pm Sunday     [facility] at [facility] Get Driving Directions  [phone] [address], [place] [address] . 8 am to 8 pm Monday-Friday . 8 am to 4 pm [facility] at [place] Get Driving Directions [phone] [address] [place], [place] [address] . 12 pm to 6 pm Monday-Friday      Your e-visit answers were reviewed by a board certified advanced clinical practitioner to complete your personal care plan.  Thank you for using e-Visits.    Approximately 5 minutes was spent  documenting and reviewing patient's chart.

## 2020-09-04 ENCOUNTER — Ambulatory Visit (INDEPENDENT_AMBULATORY_CARE_PROVIDER_SITE_OTHER): Payer: BC Managed Care – PPO | Admitting: Family Medicine

## 2020-09-04 ENCOUNTER — Encounter: Payer: Self-pay | Admitting: Family Medicine

## 2020-09-04 ENCOUNTER — Other Ambulatory Visit: Payer: Self-pay

## 2020-09-04 VITALS — BP 108/78 | HR 85 | Temp 98.3°F | Ht 62.0 in | Wt 124.6 lb

## 2020-09-04 DIAGNOSIS — R110 Nausea: Secondary | ICD-10-CM | POA: Diagnosis not present

## 2020-09-04 DIAGNOSIS — R140 Abdominal distension (gaseous): Secondary | ICD-10-CM

## 2020-09-04 LAB — CBC WITH DIFFERENTIAL/PLATELET
Basophils Absolute: 0.1 10*3/uL (ref 0.0–0.1)
Basophils Relative: 1.3 % (ref 0.0–3.0)
Eosinophils Absolute: 0.2 10*3/uL (ref 0.0–0.7)
Eosinophils Relative: 5.6 % — ABNORMAL HIGH (ref 0.0–5.0)
HCT: 40.8 % (ref 36.0–46.0)
Hemoglobin: 13.5 g/dL (ref 12.0–15.0)
Lymphocytes Relative: 39.1 % (ref 12.0–46.0)
Lymphs Abs: 1.5 10*3/uL (ref 0.7–4.0)
MCHC: 33.2 g/dL (ref 30.0–36.0)
MCV: 83.2 fl (ref 78.0–100.0)
Monocytes Absolute: 0.3 10*3/uL (ref 0.1–1.0)
Monocytes Relative: 6.6 % (ref 3.0–12.0)
Neutro Abs: 1.8 10*3/uL (ref 1.4–7.7)
Neutrophils Relative %: 47.4 % (ref 43.0–77.0)
Platelets: 220 10*3/uL (ref 150.0–400.0)
RBC: 4.91 Mil/uL (ref 3.87–5.11)
RDW: 13.9 % (ref 11.5–15.5)
WBC: 3.8 10*3/uL — ABNORMAL LOW (ref 4.0–10.5)

## 2020-09-04 LAB — COMPREHENSIVE METABOLIC PANEL
ALT: 13 U/L (ref 0–35)
AST: 19 U/L (ref 0–37)
Albumin: 4.4 g/dL (ref 3.5–5.2)
Alkaline Phosphatase: 57 U/L (ref 39–117)
BUN: 10 mg/dL (ref 6–23)
CO2: 28 mEq/L (ref 19–32)
Calcium: 9.3 mg/dL (ref 8.4–10.5)
Chloride: 105 mEq/L (ref 96–112)
Creatinine, Ser: 0.66 mg/dL (ref 0.40–1.20)
GFR: 116.53 mL/min (ref >60.00)
Glucose, Bld: 71 mg/dL (ref 70–99)
Potassium: 3.7 mEq/L (ref 3.5–5.1)
Sodium: 141 mEq/L (ref 135–145)
Total Bilirubin: 0.5 mg/dL (ref 0.2–1.2)
Total Protein: 6.9 g/dL (ref 6.0–8.3)

## 2020-09-04 LAB — TSH: TSH: 1.4 u[IU]/mL (ref 0.35–4.50)

## 2020-09-04 NOTE — Patient Instructions (Signed)
-  so labs today (thyroid, celiac, gallbladder, ovarian marker). If all normal we can check ultrasound to rule out gallstones.  -if all of this is negative: will send to GI.   FODMOP diet given for bloating.   So good to see you!  Aw

## 2020-09-04 NOTE — Progress Notes (Signed)
Patient: Sally Oliver MRN: [id number] DOB: [birth year] PCP: Orma Flaming, MD     Subjective:  Chief Complaint  Patient presents with  . Nausea  . Bloated    HPI: The patient is a 30 y.o. female who presents today for nausea and bloating. She says this started about a month ago. She went to her OBGYN, but was pregnancy testing was normal. She was referred to PCP. She has been taking a Pre/Probiotic and also dieting, but still does not help with symptoms. She states she has intermittent nausea and bloating but they are not related. She has taken things out of her diet with no relief including gluten. She can tell a difference with fried foods (worsening nausea). No nausea at all with eating besides the fried foods. Nothing makes it better and nausea can last for varying times. She denies any changes a month ago (no illness). She denies any abdominal pain at all. She does endorse mild acid reflux at night, but denies any burning, metal taste in mouth, coughing, chest pain.    She has had no night sweats/chills/fevers, no blood in stool, no weight loss, no shortness of breath. She has no changes in her stool consistency. No actual vomiting. No headaches/vision changes.   No family hx of IBD/celiacs.   Review of Systems  Constitutional: Negative for chills, fatigue, fever and unexpected weight change.  HENT: Negative for dental problem, ear pain, hearing loss and trouble swallowing.   Eyes: Negative for visual disturbance.  Respiratory: Negative for cough, chest tightness and shortness of breath.   Cardiovascular: Negative for chest pain, palpitations and leg swelling.  Gastrointestinal: Positive for abdominal distention and nausea. Negative for abdominal pain, blood in stool, constipation, diarrhea and vomiting.  Endocrine: Negative for cold intolerance, polydipsia, polyphagia and polyuria.  Genitourinary: Negative for dysuria and hematuria.  Musculoskeletal: Negative for  arthralgias.  Skin: Negative for rash.  Neurological: Negative for dizziness and headaches.  Psychiatric/Behavioral: Negative for dysphoric mood and sleep disturbance. The patient is not nervous/anxious.     Allergies Patient has No Known Allergies.  Past Medical History Patient  has a past medical history of Chicken pox, Cholestasis during pregnancy, History of endometriosis, Migraine headache, and Scoliosis.  Surgical History Patient  has a past surgical history that includes Wisdom tooth extraction; Breast lumpectomy (2011); and Breast biopsy (2014).  Family History Pateint's family history includes Arthritis in her father; Diverticulitis in her paternal grandmother; Healthy in her maternal grandmother; Heart disease in her father; Migraines in her mother; Stroke in her father and maternal grandfather.  Social History Patient  reports that she has never smoked. She has never used smokeless tobacco. She reports current alcohol use. She reports that she does not use drugs.    Objective: Vitals:   09/04/20 1053  BP: 108/78  Pulse: 85  Temp: 98.3 F (36.8 C)  TempSrc: Temporal  SpO2: 100%  Weight: 124 lb 9.6 oz (56.5 kg)  Height: 5\' 2"  (1.575 m)    Body mass index is 22.79 kg/m.  Physical Exam Vitals reviewed.  Constitutional:      Appearance: Normal appearance. She is normal weight.  HENT:     Head: Normocephalic and atraumatic.     Nose: Nose normal.     Mouth/Throat:     Mouth: Mucous membranes are moist.  Cardiovascular:     Rate and Rhythm: Normal rate and regular rhythm.     Heart sounds: Normal heart sounds.  Pulmonary:     Effort:  Pulmonary effort is normal.     Breath sounds: Normal breath sounds.  Abdominal:     General: Abdomen is flat. Bowel sounds are normal. There is no distension.     Palpations: Abdomen is soft. There is no mass.     Tenderness: There is no abdominal tenderness. There is no guarding or rebound.     Comments: Negative murphys sign    Musculoskeletal:     Cervical back: Normal range of motion and neck supple.  Lymphadenopathy:     Cervical: No cervical adenopathy.  Skin:    Capillary Refill: Capillary refill takes less than 2 seconds.  Neurological:     General: No focal deficit present.     Mental Status: She is alert and oriented to person, place, and time.  Psychiatric:        Mood and Affect: Mood normal.        Behavior: Behavior normal.        Assessment/plan: 1. Nausea Large differential. Gallbladder/thyroid/celiacs/GERD/gastritis. Has been a month so doubtful any viral illness. Checking labs and discussed ultrasound once back. Declines any anti-emetics as she states her nausea is not that bad. Discussed gastritis/reflux medication but she declines this as well. If work up negative discussed will send to GI.  - CBC with Differential/Platelet - Comprehensive metabolic panel - TSH - Gliadin antibodies, serum - Tissue transglutaminase, IgA - Reticulin Antibody, IgA w reflex titer  2. Bloating Exam normal with no appreciable distention. Checking labs and FODMOP diet given.  - Gliadin antibodies, serum - Tissue transglutaminase, IgA - Reticulin Antibody, IgA w reflex titer - CA 125    Return if symptoms worsen or fail to improve.   Orma Flaming, MD [place]   09/04/2020

## 2020-09-06 ENCOUNTER — Encounter: Payer: Self-pay | Admitting: Family Medicine

## 2020-09-07 LAB — GLIADIN ANTIBODIES, SERUM
Gliadin IgA: <1 U/mL
Gliadin IgG: 1.2 U/mL

## 2020-09-07 LAB — TISSUE TRANSGLUTAMINASE, IGA: (tTG) Ab, IgA: <1 U/mL

## 2020-09-07 LAB — CA 125: CA 125: 14 U/mL (ref <35)

## 2020-09-07 LAB — RETICULIN ANTIBODIES, IGA W TITER: Reticulin IgA Screen: NEGATIVE

## 2020-09-19 ENCOUNTER — Encounter: Payer: Self-pay | Admitting: Family

## 2020-09-19 ENCOUNTER — Telehealth: Payer: Self-pay

## 2020-09-19 ENCOUNTER — Telehealth (INDEPENDENT_AMBULATORY_CARE_PROVIDER_SITE_OTHER): Payer: BC Managed Care – PPO | Admitting: Family

## 2020-09-19 ENCOUNTER — Other Ambulatory Visit: Payer: Self-pay

## 2020-09-19 VITALS — Ht 62.0 in | Wt 124.6 lb

## 2020-09-19 DIAGNOSIS — J3489 Other specified disorders of nose and nasal sinuses: Secondary | ICD-10-CM | POA: Diagnosis not present

## 2020-09-19 DIAGNOSIS — J019 Acute sinusitis, unspecified: Secondary | ICD-10-CM

## 2020-09-19 DIAGNOSIS — B9789 Other viral agents as the cause of diseases classified elsewhere: Secondary | ICD-10-CM | POA: Diagnosis not present

## 2020-09-19 MED ORDER — FLUTICASONE PROPIONATE 50 MCG/ACT NA SUSP: 2.0000 | Freq: Every day | NASAL | 6 refills | Status: DC

## 2020-09-19 NOTE — Telephone Encounter (Signed)
  LAST APPOINTMENT DATE: 09/04/2020    MEDICATION:fluticasone (FLONASE) 50 MCG/ACT nasal spray   PHARMACY: [id number] - [address], [place] - [place]

## 2020-09-19 NOTE — Telephone Encounter (Signed)
Medication has been refilled.

## 2020-09-19 NOTE — Progress Notes (Signed)
   Virtual Visit via Video   I connected with patient on 09/19/20 at  1:20 PM EDT by a video enabled telemedicine application and verified that I am speaking with the correct person using two identifiers.  Location patient: Home Location provider: [place], Office Persons participating in the virtual visit: Dutch Quint  I discussed the limitations of evaluation and management by telemedicine and the availability of in person appointments. The patient expressed understanding and agreed to proceed.  Subjective:   HPI:   30 year old female presents today with complaints of worsening sinus pressure, sinus pain, runny nose, clear watery drainage ongoing since last night.  Took a hot shower that is helps has not been able to take any medications at this point.  Denies any fever or chills.  She is vaccinated against COVID 19.  ROS:   See pertinent positives and negatives per HPI.  Patient Active Problem List   Diagnosis Date Noted  . Encounter for planned induction of labor 06/04/2019  . SVD (spontaneous vaginal delivery) 06/04/2019  . Postpartum care following vaginal delivery 1/15 06/04/2019  . Maternal anemia, with delivery 06/04/2019  . Rh negative, maternal 09/13/2015  . Supervision of normal pregnancy 09/12/2015  . Fibroadenoma of left breast 01/21/2013    Social History   Tobacco Use  . Smoking status: Never Smoker  . Smokeless tobacco: Never Used  Substance Use Topics  . Alcohol use: Yes    Alcohol/week: 0.0 standard drinks    Comment: social-occasional    Current Outpatient Medications:  .  fluticasone (FLONASE) 50 MCG/ACT nasal spray, Place 2 sprays into both nostrils daily., Disp: 16 g, Rfl: 6 .  Prenatal Vit-Fe Fumarate-FA (PRENATAL MULTIVITAMIN) TABS tablet, Take 1 tablet by mouth daily at 12 noon. (Patient not taking: Reported on 09/19/2020), Disp: , Rfl:   No Known Allergies  Objective:   Ht 5\' 2"  (1.575 m)   Wt 124 lb 9 oz (56.5 kg)   BMI  22.78 kg/m   Patient is well-developed, well-nourished in no acute distress. In her car but parked. Head is normocephalic, atraumatic.  No labored breathing.  Speech is clear and coherent with logical content.  Patient is alert and oriented at baseline.    Assessment and Plan:    Zykeria was seen today for facial pain, epistaxis and nasal congestion.  Diagnoses and all orders for this visit:  Sinus pressure  Acute viral sinusitis  Other orders -     fluticasone (FLONASE) 50 MCG/ACT nasal spray; Place 2 sprays into both nostrils daily.  Call the office if symptoms worsen or persist.  Over-the-counter Claritin once a day.   Kennyth Arnold, FNP 09/19/2020

## 2020-09-22 ENCOUNTER — Other Ambulatory Visit: Payer: Self-pay | Admitting: Family Medicine

## 2020-09-22 DIAGNOSIS — R110 Nausea: Secondary | ICD-10-CM

## 2020-10-11 ENCOUNTER — Ambulatory Visit
Admission: RE | Admit: 2020-10-11 | Discharge: 2020-10-11 | Disposition: A | Discharge status: Home / Self Care | Payer: BC Managed Care – PPO | Source: Ambulatory Visit | Attending: Family Medicine | Admitting: Family Medicine

## 2020-10-11 DIAGNOSIS — R110 Nausea: Secondary | ICD-10-CM

## 2021-05-07 ENCOUNTER — Telehealth: Payer: Self-pay

## 2021-06-18 DIAGNOSIS — T8332XA Displacement of intrauterine contraceptive device, initial encounter: Secondary | ICD-10-CM | POA: Diagnosis not present

## 2021-06-22 ENCOUNTER — Ambulatory Visit
Admission: RE | Admit: 2021-06-22 | Discharge: 2021-06-22 | Disposition: A | Discharge status: Home / Self Care | Payer: Self-pay | Source: Ambulatory Visit | Attending: Obstetrics and Gynecology | Admitting: Obstetrics and Gynecology

## 2021-06-22 ENCOUNTER — Other Ambulatory Visit: Payer: Self-pay | Admitting: Obstetrics and Gynecology

## 2021-06-22 ENCOUNTER — Other Ambulatory Visit: Payer: Self-pay

## 2021-06-22 DIAGNOSIS — T8332XA Displacement of intrauterine contraceptive device, initial encounter: Secondary | ICD-10-CM

## 2021-07-26 ENCOUNTER — Other Ambulatory Visit: Payer: Self-pay | Admitting: Obstetrics and Gynecology

## 2021-08-08 ENCOUNTER — Other Ambulatory Visit: Payer: Self-pay | Admitting: Obstetrics and Gynecology

## 2021-08-08 DIAGNOSIS — N6310 Unspecified lump in the right breast, unspecified quadrant: Secondary | ICD-10-CM

## 2021-08-10 ENCOUNTER — Other Ambulatory Visit: Payer: Self-pay | Admitting: Obstetrics and Gynecology

## 2021-08-13 ENCOUNTER — Other Ambulatory Visit: Payer: Self-pay

## 2021-08-13 ENCOUNTER — Encounter (HOSPITAL_BASED_OUTPATIENT_CLINIC_OR_DEPARTMENT_OTHER): Payer: Self-pay | Admitting: Obstetrics and Gynecology

## 2021-08-13 NOTE — Progress Notes (Signed)
Spoke w/ via phone for pre-op interview: patient ?Lab needs dos: T&S, CBC, BMP, HCG ?Lab results: NA ?COVID test: patient states asymptomatic no test needed. ?Arrive at 11:30 08/16/21 ?NPO after MN except clear liquids.Clear liquids from MN until 10:30 AM ?Med rec completed. ?Medications to take morning of surgery: none ?Diabetic medication: NA ?Patient instructed no nail polish to be worn day of surgery. ?Patient instructed to bring photo id and insurance card day of surgery. ?Patient aware to have Driver (ride ) / caregiver for 24 hours after surgery. (Husband to drive) ?Patient Special Instructions: NA ?Pre-Op special Istructions: NA ?Patient verbalized understanding of instructions that were given at this phone interview. ?Patient denies shortness of breath, chest pain, fever, cough at this phone interview.  ?

## 2021-08-14 ENCOUNTER — Encounter (HOSPITAL_COMMUNITY)
Admission: RE | Admit: 2021-08-14 | Discharge: 2021-08-14 | Disposition: A | Discharge status: Home / Self Care | Payer: 59 | Source: Ambulatory Visit | Attending: Obstetrics and Gynecology | Admitting: Obstetrics and Gynecology

## 2021-08-14 DIAGNOSIS — Z01812 Encounter for preprocedural laboratory examination: Secondary | ICD-10-CM | POA: Diagnosis present

## 2021-08-14 LAB — CBC
HCT: 43.8 % (ref 36.0–46.0)
Hemoglobin: 14 g/dL (ref 12.0–15.0)
MCH: 27.3 pg (ref 26.0–34.0)
MCHC: 32 g/dL (ref 30.0–36.0)
MCV: 85.5 fL (ref 80.0–100.0)
Platelets: 272 10*3/uL (ref 150–400)
RBC: 5.12 MIL/uL — ABNORMAL HIGH (ref 3.87–5.11)
RDW: 12.9 % (ref 11.5–15.5)
WBC: 4.9 10*3/uL (ref 4.0–10.5)
nRBC: 0 % (ref 0.0–0.2)

## 2021-08-14 LAB — BASIC METABOLIC PANEL
Anion gap: 5 (ref 5–15)
BUN: 13 mg/dL (ref 6–20)
CO2: 23 mmol/L (ref 22–32)
Calcium: 9.2 mg/dL (ref 8.9–10.3)
Chloride: 107 mmol/L (ref 98–111)
Creatinine, Ser: 0.64 mg/dL (ref 0.44–1.00)
GFR, Estimated: >60 mL/min (ref >60)
Glucose, Bld: 85 mg/dL (ref 70–99)
Potassium: 4.2 mmol/L (ref 3.5–5.1)
Sodium: 135 mmol/L (ref 135–145)

## 2021-08-16 ENCOUNTER — Ambulatory Visit (HOSPITAL_BASED_OUTPATIENT_CLINIC_OR_DEPARTMENT_OTHER): Payer: 59 | Admitting: Certified Registered Nurse Anesthetist

## 2021-08-16 ENCOUNTER — Encounter (HOSPITAL_BASED_OUTPATIENT_CLINIC_OR_DEPARTMENT_OTHER): Payer: Self-pay | Admitting: Obstetrics and Gynecology

## 2021-08-16 ENCOUNTER — Ambulatory Visit (HOSPITAL_BASED_OUTPATIENT_CLINIC_OR_DEPARTMENT_OTHER)
Admission: RE | Admit: 2021-08-16 | Discharge: 2021-08-16 | Disposition: A | Discharge status: Home / Self Care | Payer: 59 | Attending: Obstetrics and Gynecology | Admitting: Obstetrics and Gynecology

## 2021-08-16 ENCOUNTER — Other Ambulatory Visit: Payer: Self-pay

## 2021-08-16 ENCOUNTER — Encounter (HOSPITAL_BASED_OUTPATIENT_CLINIC_OR_DEPARTMENT_OTHER)
Admission: RE | Disposition: A | Discharge status: Home / Self Care | Payer: Self-pay | Source: Home / Self Care | Attending: Obstetrics and Gynecology

## 2021-08-16 DIAGNOSIS — Y762 Prosthetic and other implants, materials and accessory obstetric and gynecological devices associated with adverse incidents: Secondary | ICD-10-CM | POA: Diagnosis not present

## 2021-08-16 DIAGNOSIS — Z302 Encounter for sterilization: Secondary | ICD-10-CM | POA: Diagnosis not present

## 2021-08-16 DIAGNOSIS — T8332XA Displacement of intrauterine contraceptive device, initial encounter: Secondary | ICD-10-CM

## 2021-08-16 HISTORY — PX: IUD REMOVAL: SHX5392

## 2021-08-16 HISTORY — PX: LAPAROSCOPIC BILATERAL SALPINGECTOMY: SHX5889

## 2021-08-16 LAB — TYPE AND SCREEN
ABO/RH(D): O NEG
Antibody Screen: NEGATIVE

## 2021-08-16 LAB — POCT PREGNANCY, URINE: Preg Test, Ur: NEGATIVE

## 2021-08-16 SURGERY — REMOVAL, INTRAUTERINE DEVICE
Anesthesia: General | Site: Abdomen

## 2021-08-16 MED ORDER — LIDOCAINE HCL (PF) 2 % IJ SOLN
INTRAMUSCULAR | Status: AC
  Filled 2021-08-16: qty 5

## 2021-08-16 MED ORDER — MIDAZOLAM HCL 2 MG/2ML IJ SOLN
INTRAMUSCULAR | Status: AC
  Filled 2021-08-16: qty 2

## 2021-08-16 MED ORDER — GABAPENTIN 300 MG PO CAPS
ORAL_CAPSULE | ORAL | Status: AC
  Filled 2021-08-16: qty 1

## 2021-08-16 MED ORDER — SCOPOLAMINE 1 MG/3DAYS TD PT72
MEDICATED_PATCH | TRANSDERMAL | Status: DC | PRN
  Administered 2021-08-16: 1 via TRANSDERMAL

## 2021-08-16 MED ORDER — POVIDONE-IODINE 10 % EX SWAB: 2.0000 "application " | Freq: Once | CUTANEOUS | Status: DC

## 2021-08-16 MED ORDER — ACETAMINOPHEN 500 MG PO TABS
1000.0000 mg | ORAL_TABLET | Freq: Once | ORAL | Status: AC
  Administered 2021-08-16: 1000 mg via ORAL

## 2021-08-16 MED ORDER — MIDAZOLAM HCL 2 MG/2ML IJ SOLN
INTRAMUSCULAR | Status: DC | PRN
  Administered 2021-08-16: 2 mg via INTRAVENOUS

## 2021-08-16 MED ORDER — KETOROLAC TROMETHAMINE 30 MG/ML IJ SOLN
INTRAMUSCULAR | Status: DC | PRN
  Administered 2021-08-16: 30 mg via INTRAVENOUS

## 2021-08-16 MED ORDER — FENTANYL CITRATE (PF) 100 MCG/2ML IJ SOLN
INTRAMUSCULAR | Status: AC
  Filled 2021-08-16: qty 2

## 2021-08-16 MED ORDER — BUPIVACAINE HCL (PF) 0.25 % IJ SOLN
INTRAMUSCULAR | Status: DC | PRN
Start: 2021-08-16 — End: 2021-08-16
  Administered 2021-08-16: 10 mL

## 2021-08-16 MED ORDER — PROPOFOL 10 MG/ML IV BOLUS
INTRAVENOUS | Status: DC | PRN
Start: 2021-08-16 — End: 2021-08-16
  Administered 2021-08-16: 130 mg via INTRAVENOUS

## 2021-08-16 MED ORDER — OXYCODONE HCL 5 MG PO TABS: 5.0000 mg | ORAL_TABLET | Freq: Four times a day (QID) | ORAL | 0 refills | Status: DC | PRN

## 2021-08-16 MED ORDER — ACETAMINOPHEN 500 MG PO TABS
ORAL_TABLET | ORAL | Status: AC
  Filled 2021-08-16: qty 2

## 2021-08-16 MED ORDER — GABAPENTIN 300 MG PO CAPS
300.0000 mg | ORAL_CAPSULE | Freq: Once | ORAL | Status: AC
Start: 2021-08-16 — End: 2021-08-16
  Administered 2021-08-16: 300 mg via ORAL

## 2021-08-16 MED ORDER — FENTANYL CITRATE (PF) 250 MCG/5ML IJ SOLN
INTRAMUSCULAR | Status: DC | PRN
  Administered 2021-08-16 (×2): 25 ug via INTRAVENOUS
  Administered 2021-08-16: 50 ug via INTRAVENOUS

## 2021-08-16 MED ORDER — SCOPOLAMINE 1 MG/3DAYS TD PT72
MEDICATED_PATCH | TRANSDERMAL | Status: AC
  Filled 2021-08-16: qty 1

## 2021-08-16 MED ORDER — AMISULPRIDE (ANTIEMETIC) 5 MG/2ML IV SOLN
10.0000 mg | Freq: Once | INTRAVENOUS | Status: AC | PRN
  Administered 2021-08-16: 10 mg via INTRAVENOUS

## 2021-08-16 MED ORDER — ONDANSETRON HCL 4 MG/2ML IJ SOLN
INTRAMUSCULAR | Status: AC
  Filled 2021-08-16: qty 2

## 2021-08-16 MED ORDER — DEXMEDETOMIDINE (PRECEDEX) IN NS 20 MCG/5ML (4 MCG/ML) IV SYRINGE
PREFILLED_SYRINGE | INTRAVENOUS | Status: DC | PRN
Start: 2021-08-16 — End: 2021-08-16
  Administered 2021-08-16: 8 ug via INTRAVENOUS

## 2021-08-16 MED ORDER — AMISULPRIDE (ANTIEMETIC) 5 MG/2ML IV SOLN
INTRAVENOUS | Status: AC
  Filled 2021-08-16: qty 4

## 2021-08-16 MED ORDER — PROPOFOL 10 MG/ML IV BOLUS
INTRAVENOUS | Status: AC
  Filled 2021-08-16: qty 20

## 2021-08-16 MED ORDER — 0.9 % SODIUM CHLORIDE (POUR BTL) OPTIME
TOPICAL | Status: DC | PRN
  Administered 2021-08-16: 500 mL

## 2021-08-16 MED ORDER — PHENYLEPHRINE 40 MCG/ML (10ML) SYRINGE FOR IV PUSH (FOR BLOOD PRESSURE SUPPORT)
PREFILLED_SYRINGE | INTRAVENOUS | Status: DC | PRN
Start: 2021-08-16 — End: 2021-08-16
  Administered 2021-08-16: 80 ug via INTRAVENOUS

## 2021-08-16 MED ORDER — IBUPROFEN 800 MG PO TABS: 800.0000 mg | ORAL_TABLET | Freq: Three times a day (TID) | ORAL | 0 refills | Status: DC | PRN

## 2021-08-16 MED ORDER — LIDOCAINE 2% (20 MG/ML) 5 ML SYRINGE
INTRAMUSCULAR | Status: DC | PRN
Start: 2021-08-16 — End: 2021-08-16
  Administered 2021-08-16: 60 mg via INTRAVENOUS

## 2021-08-16 MED ORDER — DEXMEDETOMIDINE (PRECEDEX) IN NS 20 MCG/5ML (4 MCG/ML) IV SYRINGE
PREFILLED_SYRINGE | INTRAVENOUS | Status: AC
  Filled 2021-08-16: qty 10

## 2021-08-16 MED ORDER — SUGAMMADEX SODIUM 200 MG/2ML IV SOLN
INTRAVENOUS | Status: DC | PRN
  Administered 2021-08-16: 200 mg via INTRAVENOUS

## 2021-08-16 MED ORDER — ONDANSETRON HCL 4 MG/2ML IJ SOLN
INTRAMUSCULAR | Status: DC | PRN
  Administered 2021-08-16: 4 mg via INTRAVENOUS

## 2021-08-16 MED ORDER — ROCURONIUM BROMIDE 10 MG/ML (PF) SYRINGE
PREFILLED_SYRINGE | INTRAVENOUS | Status: DC | PRN
  Administered 2021-08-16: 40 mg via INTRAVENOUS

## 2021-08-16 MED ORDER — FENTANYL CITRATE (PF) 100 MCG/2ML IJ SOLN: 25.0000 ug | INTRAMUSCULAR | Status: DC | PRN

## 2021-08-16 MED ORDER — LACTATED RINGERS IV SOLN: INTRAVENOUS | Status: DC

## 2021-08-16 MED ORDER — PHENYLEPHRINE 40 MCG/ML (10ML) SYRINGE FOR IV PUSH (FOR BLOOD PRESSURE SUPPORT)
PREFILLED_SYRINGE | INTRAVENOUS | Status: AC
  Filled 2021-08-16: qty 10

## 2021-08-16 MED ORDER — ROCURONIUM BROMIDE 10 MG/ML (PF) SYRINGE
PREFILLED_SYRINGE | INTRAVENOUS | Status: AC
  Filled 2021-08-16: qty 10

## 2021-08-16 MED ORDER — DEXAMETHASONE SODIUM PHOSPHATE 10 MG/ML IJ SOLN
INTRAMUSCULAR | Status: DC | PRN
  Administered 2021-08-16: 10 mg via INTRAVENOUS

## 2021-08-16 MED ORDER — DEXAMETHASONE SODIUM PHOSPHATE 10 MG/ML IJ SOLN
INTRAMUSCULAR | Status: AC
Start: 2021-08-16 — End: ?
  Filled 2021-08-16: qty 1

## 2021-08-16 SURGICAL SUPPLY — 33 items
ADH SKN CLS APL DERMABOND .7 (GAUZE/BANDAGES/DRESSINGS) ×3
BAG RETRIEVAL 10 (BASKET)
CABLE HIGH FREQUENCY MONO STRZ (ELECTRODE) IMPLANT
DERMABOND ADVANCED (GAUZE/BANDAGES/DRESSINGS) ×1
DERMABOND ADVANCED .7 DNX12 (GAUZE/BANDAGES/DRESSINGS) ×3 IMPLANT
DRSG OPSITE POSTOP 3X4 (GAUZE/BANDAGES/DRESSINGS) ×2 IMPLANT
DURAPREP 26ML APPLICATOR (WOUND CARE) ×4 IMPLANT
GAUZE 4X4 16PLY ~~LOC~~+RFID DBL (SPONGE) ×4 IMPLANT
GLOVE SURG LTX SZ6.5 (GLOVE) ×4 IMPLANT
GLOVE SURG UNDER POLY LF SZ7 (GLOVE) ×16 IMPLANT
GOWN STRL REUS W/TWL LRG LVL3 (GOWN DISPOSABLE) ×16 IMPLANT
KIT TURNOVER CYSTO (KITS) ×4 IMPLANT
LIGASURE VESSEL 5MM BLUNT TIP (ELECTROSURGICAL) ×2 IMPLANT
MANIPULATOR UTERINE 4.5 ZUMI (MISCELLANEOUS) IMPLANT
NEEDLE INSUFFLATION 120MM (ENDOMECHANICALS) ×4 IMPLANT
NS IRRIG 500ML POUR BTL (IV SOLUTION) ×2 IMPLANT
PACK LAPAROSCOPY BASIN (CUSTOM PROCEDURE TRAY) ×4 IMPLANT
PACK TRENDGUARD 450 HYBRID PRO (MISCELLANEOUS) ×1 IMPLANT
PROTECTOR NERVE ULNAR (MISCELLANEOUS) ×2 IMPLANT
SCISSORS LAP 5X35 DISP (ENDOMECHANICALS) IMPLANT
SET IRRIG TUBING LAPAROSCOPIC (IRRIGATION / IRRIGATOR) IMPLANT
SET TUBE SMOKE EVAC HIGH FLOW (TUBING) ×4 IMPLANT
SUT VICRYL 0 UR6 27IN ABS (SUTURE) ×4 IMPLANT
SUT VICRYL 4-0 PS2 18IN ABS (SUTURE) ×4 IMPLANT
SYR 5ML LL (SYRINGE) ×4 IMPLANT
SYS BAG RETRIEVAL 10MM (BASKET)
SYSTEM BAG RETRIEVAL 10MM (BASKET) IMPLANT
TOWEL OR 17X26 10 PK STRL BLUE (TOWEL DISPOSABLE) ×6 IMPLANT
TRAY FOLEY W/BAG SLVR 14FR LF (SET/KITS/TRAYS/PACK) ×4 IMPLANT
TRENDGUARD 450 HYBRID PRO PACK (MISCELLANEOUS) ×4
TROCAR XCEL NON-BLD 11X100MML (ENDOMECHANICALS) IMPLANT
TROCAR XCEL NON-BLD 5MMX100MML (ENDOMECHANICALS) ×10 IMPLANT
WARMER LAPAROSCOPE (MISCELLANEOUS) ×4 IMPLANT

## 2021-08-16 NOTE — Anesthesia Procedure Notes (Signed)
Procedure Name: Intubation ?Date/Time: 08/16/2021 12:05 PM ?Performed by: Clearnce Sorrel, CRNA ?Pre-anesthesia Checklist: Patient identified, Emergency Drugs available, Suction available and Patient being monitored ?Patient Re-evaluated:Patient Re-evaluated prior to induction ?Oxygen Delivery Method: Circle System Utilized ?Preoxygenation: Pre-oxygenation with 100% oxygen ?Induction Type: IV induction ?Ventilation: Mask ventilation without difficulty ?Laryngoscope Size: Mac and 3 ?Grade View: Grade I ?Tube type: Oral ?Tube size: 7.0 mm ?Number of attempts: 1 ?Airway Equipment and Method: Stylet and Oral airway ?Placement Confirmation: ETT inserted through vocal cords under direct vision, positive ETCO2 and breath sounds checked- equal and bilateral ?Secured at: 22 cm ?Tube secured with: Tape ?Dental Injury: Teeth and Oropharynx as per pre-operative assessment  ? ? ? ? ?

## 2021-08-16 NOTE — Discharge Instructions (Signed)

## 2021-08-16 NOTE — Op Note (Signed)
Sally Oliver ?July 17, 1990 ?[id number] ? ? ?Operative Note ? ?PROCEDURE: laparoscopic bilateral salpingectomy and removal of retained IUD ? ?PRE-OPERATIVE DIAGNOSIS:  ?Malpositioned IUD ?Desires permanent female sterilization ? ?POST-OPERATIVE DIAGNOSIS: ?Malpositioned IUD ?Desires permanent female sterilization ? ?SURGEON: Dr. Langley Gauss, DO ? ?ASSISTANT: Derrell Lolling, CNM ? ?FINDINGS: pelvic exam reveals normal external female genitalia, multiparous ectocervix without lesions, laparoscopy reveals normal female pelvic anatomy including small mobile uterus with smooth contours, normal appearing bilateral fallopian tubes and ovaries. The Mirena IUD was noted to be in the left posterior cul de sac with strings wrapping around the fundal aspect of the uterus to the anterior surface, no adhesions appreciated and easily removed ? ?SPECIMENS: bilateral fallopian tubes. Mirena IUD was disposed of ? ?EBL: minimal ? ?FLUIDS: per anesthesia  ? ?COMPLICATIONS: None ? ?PROCEDURE IN DETAIL:  ? ?After the patient was appropriately consented in the holding area, she was taken to the operating room where general anesthesia was administered without complications. The patient was placed in the dorsal lithotomy position. Bilateral arms were tucked with the appropriate barrier padding. The patient was prepped and draped in the usual sterile fashion. An appropriate time out was performed that verified the correct patient, procedure, and surgical team.  ? ?Pelvic exam performed. The Foley catheter was placed into the bladder, drained of clear yellow urine and remained in place for the duration of the procedure. Sally sterile speculum was inserted into the vagina and the cervix was visualized. Sally single tooth tenaculum was used to grasp the anterior lip of the cervix. An acorn uterine manipulator was inserted through the cervix and secured into place. Attention was turned to the abdomen. ? ?Sally scalpel was used to make Sally small  horizontal skin incision just inferior to the umbilicus. The Veress needle was advanced through the skin incision until 2 clicks were appreciated. The gas was connected and turned on and Sally low opening pressure was noted. Pneumoperitoneum was achieved without complications. Sally 5 mm laparoscope and trocar sleeve was inserted through the umbilical incision and the abdomen was entered under direct visualization. Inspection of the abdomen revealed the findings as noted above and no obvious injuries noted. The patient was placed in Trendelenburg positioning to facilitate pelvic visualization. Lower quadrant ports were placed bilaterally at points approximately 2 cm superior and 2 cm medial to the ASIS. 5 mm trocars were placed bilaterally under direct laparoscopic visualization. An atraumatic grasper was used to grasp the IUD out of the cul de sac at the stem, the strings were then grasped and the IUD removed through the 5 mm trocar intact. The IUD was disposed of. The salpingectomy was then performed in the usual fashion. Starting on the left side, the LigaSure device was used to sequentially cauterize and cut the mesosalpinx just inferior to the fallopian tube and transecting the fallopian tube medially at the cornual region. Attention was then turned to the right and transection performed in Sally similar fashion. The fallopian tubes were removed through the lateral ports and sent for final surgical pathology. Excellent hemostasis was noted at the surgical site. The pressure was reduced and the patient laid flat and continued hemostasis noted. The gas was released from the abdomen. The skin incisions were closed with 4-0 Vicryl and skin glue. Local anesthetic using 0.25% Marcaine was injected at the incision sites. The uterine manipulator and Foley catheter were removed. The patient tolerated the procedure well and was taken to the recovery room in stable condition. All lap, needle, and instrument  counts were  correct. ? ?Sally Oliver Sally Oliver ?08/16/21 ?12:56 PM ? ? ?

## 2021-08-16 NOTE — Anesthesia Postprocedure Evaluation (Signed)
Anesthesia Post Note ? ?Patient: Sally Oliver ? ?Procedure(s) Performed: INTRAUTERINE DEVICE (IUD) REMOVAL (Abdomen) ?LAPAROSCOPIC BILATERAL TUBAL LIGATION BY SALPINGECTOMY (Bilateral: Abdomen) ? ?  ? ?Patient location during evaluation: PACU ?Anesthesia Type: General ?Level of consciousness: awake and alert ?Pain management: pain level controlled ?Vital Signs Assessment: post-procedure vital signs reviewed and stable ?Respiratory status: spontaneous breathing, nonlabored ventilation, respiratory function stable and patient connected to nasal cannula oxygen ?Cardiovascular status: blood pressure returned to baseline and stable ?Postop Assessment: no apparent nausea or vomiting ?Anesthetic complications: no ? ? ?No notable events documented. ? ?Last Vitals:  ?Vitals:  ? 08/16/21 1330 08/16/21 1345  ?BP: 96/62   ?Pulse: 76   ?Resp: 12   ?Temp:  36.4 ?C  ?SpO2: 100%   ?  ?Last Pain:  ?Vitals:  ? 08/16/21 1330  ?TempSrc:   ?PainSc: 0-No pain  ? ? ?  ?  ?  ?  ?  ?  ? ?Suzette Battiest E ? ? ? ? ?

## 2021-08-16 NOTE — H&P (Signed)
Sally Oliver is an 31 y.o. female presenting for scheduled surgery.  ? ?Patient is a 30Y [id number] LMP 07/23/21 presenting for laparoscopic removal of IUD for malpositioned IUD and bilateral salpingectomy for female sterilization ? ?Mirena IUD placed 07/2019 at the 6w PP visit ?-Initial string check WNL ?Periods resumed 02/2021 and progressively heavier ?06/18/21 TVUS with no evidence of IUD ?06/2021 X-ray showed IUD in the left hemipelvis ? ?Patient desires tubal sterilization  ?She and husband are 454% certain they do not plan on any future pregnancies ?Patient's mom had total hysterectomy when she was in her 20's for abnormal bleeding- unclear indication but prefers to have tubes removed due to this history  ? ?PMH: none ?PSH: none ?Meds: none  ? ?Menstrual History: ?Patient's last menstrual period was 07/23/2021 (exact date). ?  ? ?Past Medical History:  ?Diagnosis Date  ? Chicken pox   ? Cholestasis during pregnancy   ? History of endometriosis   ? Migraine headache   ? Scoliosis   ? ? ?Past Surgical History:  ?Procedure Laterality Date  ? BREAST BIOPSY  2014  ? left  ? BREAST LUMPECTOMY  2011  ? left  ? WISDOM TOOTH EXTRACTION    ? ? ?Family History  ?Problem Relation Age of Onset  ? Migraines Mother   ? Heart disease Father   ? Stroke Father   ? Arthritis Father   ? Healthy Maternal Grandmother   ? Stroke Maternal Grandfather   ? Diverticulitis Paternal Grandmother   ? ? ?Social History:  reports that she has never smoked. She has never used smokeless tobacco. She reports current alcohol use. She reports that she does not use drugs. ? ?Allergies: No Known Allergies ? ?Medications Prior to Admission  ?Medication Sig Dispense Refill Last Dose  ? fluticasone (FLONASE) 50 MCG/ACT nasal spray Place 2 sprays into both nostrils daily. 16 g 6 Past Month  ? Prenatal Vit-Fe Fumarate-FA (PRENATAL MULTIVITAMIN) TABS tablet Take 1 tablet by mouth daily at 12 noon. (Patient not taking: Reported on 09/19/2020)      ? ? ?Review of Systems  ?All other systems reviewed and are negative. ? ?Blood pressure 108/76, pulse 82, temperature 97.9 ?F (36.6 ?C), temperature source Oral, resp. rate 20, height '5\' 2"'$  (1.575 m), weight 56.9 kg, last menstrual period 07/23/2021, SpO2 100 %, not currently breastfeeding. ?Physical Exam ?Vitals reviewed.  ?Constitutional:   ?   Appearance: Normal appearance.  ?HENT:  ?   Head: Normocephalic.  ?Cardiovascular:  ?   Rate and Rhythm: Normal rate.  ?Pulmonary:  ?   Effort: Pulmonary effort is normal.  ?Abdominal:  ?   General: Abdomen is flat.  ?   Palpations: Abdomen is soft.  ?Genitourinary: ?   General: Normal vulva.  ?Musculoskeletal:     ?   General: Normal range of motion.  ?   Cervical back: Normal range of motion.  ?Skin: ?   General: Skin is warm and dry.  ?Neurological:  ?   General: No focal deficit present.  ?   Mental Status: She is alert and oriented to person, place, and time.  ?Psychiatric:     ?   Mood and Affect: Mood normal.     ?   Behavior: Behavior normal.  ? ? ?Results for orders placed or performed during the hospital encounter of 08/16/21 (from the past 24 hour(s))  ?Pregnancy, urine POC     Status: None  ? Collection Time: 08/16/21 11:27 AM  ?Result Value Ref Range  ?  Preg Test, Ur NEGATIVE NEGATIVE  ? ? ?No results found. ? ?Assessment/Plan: ? ?30Y P2 with malpositioned IUD and desires tubal sterilization consented for laparoscopic removal of malpositioned IUD and bilateral salpingectomy ? ?Patient has been thoroughly consented for the above listed surgery both in the office and again in the preoperative holding unit today. Patient counseled on risks of surgery including but not limited to bleeding, infection, damage to surrounding organs, and inherit risks of anesthesia. We discussed depending on IUD location in the abdomen/pelvis potential for scar tissue and involvement of bowel, bladder, adnexa, major blood vessels, etc. can increase risk for complications, involvement  of surgery, and recovery time. Patient is 272% certain she and husband do not desire future fertility and wishes to proceed with tubal sterilization. We have discussed risks and benefits of tubal ligation versus salpingectomy and plan to proceed with bilateral salpingectomy. All questions answered to patient satisfaction, she has verbalized good understanding of the risks, and consents have been signed. ? ?NPO ?UPT Neg ?No antibiotics indicated ?SCD VTE ppx ?Routine intraop/postop care ?Anticipate DC home today. DC instructions reviewed with patient and plan for follow up post op check in office in 2 weeks ? ?Sally Oliver ?08/16/2021, 11:42 AM ? ?

## 2021-08-16 NOTE — OR Nursing (Signed)
IUD removed in [facility] OR #5 by Dr. Lanny Cramp. ?

## 2021-08-16 NOTE — Transfer of Care (Signed)
Immediate Anesthesia Transfer of Care Note ? ?Patient: Sally Oliver ? ?Procedure(s) Performed: INTRAUTERINE DEVICE (IUD) REMOVAL (Abdomen) ?LAPAROSCOPIC BILATERAL TUBAL LIGATION BY SALPINGECTOMY (Bilateral: Abdomen) ? ?Patient Location: PACU ? ?Anesthesia Type:General ? ?Level of Consciousness: awake, alert  and oriented ? ?Airway & Oxygen Therapy: Patient Spontanous Breathing ? ?Post-op Assessment: Report given to RN and Post -op Vital signs reviewed and stable ? ?Post vital signs: Reviewed and stable ? ?Last Vitals:  ?Vitals Value Taken Time  ?BP 109/73 08/16/21 1300  ?Temp 36.6 ?C 08/16/21 1300  ?Pulse 71 08/16/21 1307  ?Resp 11 08/16/21 1307  ?SpO2 100 % 08/16/21 1307  ?Vitals shown include unvalidated device data. ? ?Last Pain:  ?Vitals:  ? 08/16/21 1300  ?TempSrc:   ?PainSc: 0-No pain  ?   ? ?Patients Stated Pain Goal: 5 (08/16/21 1300) ? ?Complications: No notable events documented. ?

## 2021-08-16 NOTE — Anesthesia Preprocedure Evaluation (Signed)
Anesthesia Evaluation  ?Patient identified by MRN, date of birth, ID band ?Patient awake ? ? ? ?Reviewed: ?Allergy & Precautions, NPO status , Patient's Chart, lab work & pertinent test results ? ?Airway ?Mallampati: II ? ?TM Distance: >3 FB ?Neck ROM: Full ? ? ? Dental ? ?(+) Dental Advisory Given ?  ?Pulmonary ?neg pulmonary ROS,  ?  ?breath sounds clear to auscultation ? ? ? ? ? ? Cardiovascular ?negative cardio ROS ? ? ?Rhythm:Regular Rate:Normal ? ? ?  ?Neuro/Psych ?negative neurological ROS ?   ? GI/Hepatic ?negative GI ROS, Neg liver ROS,   ?Endo/Other  ?negative endocrine ROS ? Renal/GU ?negative Renal ROS  ? ?  ?Musculoskeletal ? ? Abdominal ?  ?Peds ? Hematology ?negative hematology ROS ?(+)   ?Anesthesia Other Findings ? ? Reproductive/Obstetrics ? ?  ? ? ? ? ? ? ? ? ? ? ? ? ? ?  ?  ? ? ? ? ? ? ? ? ?Anesthesia Physical ?Anesthesia Plan ? ?ASA: 1 ? ?Anesthesia Plan: General  ? ?Post-op Pain Management: Gabapentin PO (pre-op)*, Toradol IV (intra-op)* and Tylenol PO (pre-op)*  ? ?Induction: Intravenous ? ?PONV Risk Score and Plan: 4 or greater and Midazolam, Dexamethasone, Ondansetron, Scopolamine patch - Pre-op and Treatment may vary due to age or medical condition ? ?Airway Management Planned: Oral ETT ? ?Additional Equipment: None ? ?Intra-op Plan:  ? ?Post-operative Plan: Extubation in OR ? ?Informed Consent: I have reviewed the patients History and Physical, chart, labs and discussed the procedure including the risks, benefits and alternatives for the proposed anesthesia with the patient or authorized representative who has indicated his/her understanding and acceptance.  ? ? ? ?Dental advisory given ? ?Plan Discussed with: CRNA ? ?Anesthesia Plan Comments:   ? ? ? ? ? ? ?Anesthesia Quick Evaluation ? ?

## 2021-08-16 NOTE — Progress Notes (Signed)
After some extra fluids patient is feeling better, nausea has mostly resolved. ?

## 2021-08-17 ENCOUNTER — Encounter (HOSPITAL_BASED_OUTPATIENT_CLINIC_OR_DEPARTMENT_OTHER): Payer: Self-pay | Admitting: Obstetrics and Gynecology

## 2021-08-20 LAB — SURGICAL PATHOLOGY

## 2021-08-24 ENCOUNTER — Other Ambulatory Visit: Payer: 59

## 2021-08-28 ENCOUNTER — Other Ambulatory Visit: Payer: 59

## 2021-09-11 ENCOUNTER — Ambulatory Visit
Admission: RE | Admit: 2021-09-11 | Discharge: 2021-09-11 | Disposition: A | Discharge status: Home / Self Care | Payer: 59 | Source: Ambulatory Visit | Attending: Obstetrics and Gynecology | Admitting: Obstetrics and Gynecology

## 2021-09-11 ENCOUNTER — Other Ambulatory Visit: Payer: Self-pay | Admitting: Obstetrics and Gynecology

## 2021-09-11 DIAGNOSIS — N6310 Unspecified lump in the right breast, unspecified quadrant: Secondary | ICD-10-CM

## 2022-01-22 ENCOUNTER — Ambulatory Visit (INDEPENDENT_AMBULATORY_CARE_PROVIDER_SITE_OTHER): Payer: 59

## 2022-01-22 ENCOUNTER — Encounter: Payer: Self-pay | Admitting: Podiatry

## 2022-01-22 ENCOUNTER — Ambulatory Visit: Payer: 59 | Admitting: Podiatry

## 2022-01-22 DIAGNOSIS — S9001XA Contusion of right ankle, initial encounter: Secondary | ICD-10-CM | POA: Diagnosis not present

## 2022-01-22 DIAGNOSIS — S93401A Sprain of unspecified ligament of right ankle, initial encounter: Secondary | ICD-10-CM | POA: Diagnosis not present

## 2022-01-22 NOTE — Progress Notes (Signed)
   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 female presenting today as a new patient for evaluation of an injury that was sustained November 2022 when she rolled her ankle outward.  Patient sustained an inversion type ankle sprain while working in her shop.  She noticed swelling with bruising which subsided over time.  She continues to have pain and only states that she is about 60% better.  She has tried different conservative approaches including immobilization, rest, ice, compression, and daily taping and massage with no significant improvement.  She presents for further treatment and evaluation  Past Medical History:  Diagnosis Date   Chicken pox    Cholestasis during pregnancy    History of endometriosis    Migraine headache    Scoliosis     Past Surgical History:  Procedure Laterality Date   BREAST BIOPSY  05/20/2012   left   BREAST EXCISIONAL BIOPSY     BREAST LUMPECTOMY  05/20/2009   left   IUD REMOVAL N/A 08/16/2021   Procedure: INTRAUTERINE DEVICE (IUD) REMOVAL;  Surgeon: Armandina Stammer, DO;  Location: [facility];  Service: Gynecology;  Laterality: N/A;   LAPAROSCOPIC BILATERAL SALPINGECTOMY Bilateral 08/16/2021   Procedure: LAPAROSCOPIC BILATERAL TUBAL LIGATION BY SALPINGECTOMY;  Surgeon: Armandina Stammer, DO;  Location: [facility];  Service: Gynecology;  Laterality: Bilateral;   WISDOM TOOTH EXTRACTION      No Known Allergies   Physical Exam: General: The patient is alert and oriented x3 in no acute distress.  Dermatology: Skin is warm, dry and supple bilateral lower extremities. Negative for open lesions or  macerations.  Vascular: Palpable pedal pulses bilaterally. Capillary refill within normal limits.  Negative for any significant edema or erythema  Neurological: Light touch and protective threshold grossly intact  Musculoskeletal Exam: No pedal deformities noted.  There is some pain on palpation throughout the medial and anterior aspect of the right ankle  Radiographic Exam:  Normal osseous mineralization. Joint spaces preserved. No fracture/dislocation/boney destruction.    Assessment: 1.  Severe ankle sprain RT; November 2022   Plan of Care:  1. Patient evaluated. X-Rays reviewed.  2.  The patient has failed multiple conservative approaches and she has now had ankle pain for coming up on 1 year now. 3.  MRI ordered RT ankle wo contrast 4. Will call patient via telephone after MRI to communicate MRI results and discuss further treatment options  *Stay-at-home mom      Edrick Kins, DPM [facility]  Dr. Edrick Kins, DPM    [address], [place] [address]                Office [phone]  Fax [phone]

## 2022-02-04 ENCOUNTER — Ambulatory Visit
Admission: RE | Admit: 2022-02-04 | Discharge: 2022-02-04 | Disposition: A | Discharge status: Home / Self Care | Payer: 59 | Source: Ambulatory Visit | Attending: Podiatry | Admitting: Podiatry

## 2022-02-04 DIAGNOSIS — S93401A Sprain of unspecified ligament of right ankle, initial encounter: Secondary | ICD-10-CM

## 2022-02-11 ENCOUNTER — Encounter: Payer: Self-pay | Admitting: *Deleted

## 2022-02-21 NOTE — Progress Notes (Signed)
Tried calling patient to review results. No answer. Return to clinic PRN. - Dr. Amalia Hailey

## 2022-03-15 ENCOUNTER — Other Ambulatory Visit: Payer: 59

## 2022-03-27 ENCOUNTER — Ambulatory Visit
Admission: RE | Admit: 2022-03-27 | Discharge: 2022-03-27 | Disposition: A | Discharge status: Home / Self Care | Payer: 59 | Source: Ambulatory Visit | Attending: Obstetrics and Gynecology | Admitting: Obstetrics and Gynecology

## 2022-03-27 ENCOUNTER — Other Ambulatory Visit: Payer: Self-pay | Admitting: Obstetrics and Gynecology

## 2022-03-27 DIAGNOSIS — N6310 Unspecified lump in the right breast, unspecified quadrant: Secondary | ICD-10-CM

## 2022-04-02 ENCOUNTER — Ambulatory Visit
Admission: RE | Admit: 2022-04-02 | Discharge: 2022-04-02 | Disposition: A | Discharge status: Home / Self Care | Payer: 59 | Source: Ambulatory Visit | Attending: Obstetrics and Gynecology | Admitting: Obstetrics and Gynecology

## 2022-04-02 DIAGNOSIS — N6310 Unspecified lump in the right breast, unspecified quadrant: Secondary | ICD-10-CM

## 2022-04-02 HISTORY — PX: BREAST BIOPSY: SHX20

## 2022-05-02 ENCOUNTER — Encounter: Payer: Self-pay | Admitting: *Deleted

## 2022-07-30 ENCOUNTER — Other Ambulatory Visit: Payer: Self-pay | Admitting: Obstetrics and Gynecology

## 2022-07-30 DIAGNOSIS — N6310 Unspecified lump in the right breast, unspecified quadrant: Secondary | ICD-10-CM

## 2022-12-26 ENCOUNTER — Ambulatory Visit (INDEPENDENT_AMBULATORY_CARE_PROVIDER_SITE_OTHER): Payer: Self-pay | Admitting: Physician Assistant

## 2022-12-26 ENCOUNTER — Encounter: Payer: Self-pay | Admitting: Physician Assistant

## 2022-12-26 VITALS — BP 102/71 | HR 84 | Temp 97.1°F | Ht 62.0 in | Wt 127.8 lb

## 2022-12-26 DIAGNOSIS — G43109 Migraine with aura, not intractable, without status migrainosus: Secondary | ICD-10-CM

## 2022-12-26 DIAGNOSIS — R0981 Nasal congestion: Secondary | ICD-10-CM

## 2022-12-26 MED ORDER — AMOXICILLIN-POT CLAVULANATE 875-125 MG PO TABS: 1.0000 | ORAL_TABLET | Freq: Two times a day (BID) | ORAL | 0 refills | Status: DC

## 2022-12-26 NOTE — Progress Notes (Signed)
Sally Oliver is a 32 y.o. female here for a new problem.  History of Present Illness:   Chief Complaint  Patient presents with   Migraine   Sinusitis    Left side of head pain, affecting left ear    Migraine: Complains of a migraine that began 3 days ago  Reports that she does experience auras during migraine episodes.  States episodes are triggered by hormonal fluctuation, weather changes, or excessive auditory stimulation  Notes that she usually relieves episodes with a hot shower or nap; Tylenol or Motrin provide minor relief.  Having her normal migraine symptom(s) -- denies worst headache(s) of life  Sinusitis: Complains of left-sided sinus pain diffuse bilaterally to ears that began 3 days ago. Notes that she suffers from recurrent sinus infections.  States she used Zyrtec (provided minor relief) and a sinus flush (could not complete). Reports she is hydrating well.  Denies congestion, fever, and chills.   Past Medical History:  Diagnosis Date   Chicken pox    Cholestasis during pregnancy    History of endometriosis    Migraine headache    Scoliosis      Social History   Tobacco Use   Smoking status: Never   Smokeless tobacco: Never  Substance Use Topics   Alcohol use: Yes    Alcohol/week: 0.0 standard drinks of alcohol    Comment: social-occasional   Drug use: No    Past Surgical History:  Procedure Laterality Date   BREAST BIOPSY  05/20/2012   left   BREAST BIOPSY Right 04/02/2022   [place] RT BREAST BX W LOC DEV 1ST LESION IMG BX SPEC US GUIDE 04/02/2022 [facility]   BREAST BIOPSY Right 04/02/2022   [place] RT BREAST BX W LOC DEV EA ADD LESION IMG BX SPEC US GUIDE 04/02/2022 [facility]   BREAST EXCISIONAL BIOPSY Left 2011   IUD REMOVAL N/A 08/16/2021   Procedure: INTRAUTERINE DEVICE (IUD) REMOVAL;  Surgeon: Toy Baker, DO;  Location: [facility];  Service: Gynecology;  Laterality: N/A;   LAPAROSCOPIC BILATERAL  SALPINGECTOMY Bilateral 08/16/2021   Procedure: LAPAROSCOPIC BILATERAL TUBAL LIGATION BY SALPINGECTOMY;  Surgeon: Toy Baker, DO;  [facility];  Service: Gynecology;  Laterality: Bilateral;   WISDOM TOOTH EXTRACTION      Family History  Problem Relation Age of Onset   Migraines Mother    Heart disease Father    Stroke Father    Arthritis Father    Healthy Maternal Grandmother    Stroke Maternal Grandfather    Diverticulitis Paternal Grandmother     No Known Allergies  Current Medications:   Current Outpatient Medications:    cetirizine (ZYRTEC) 10 MG chewable tablet, Chew 10 mg by mouth daily., Disp: , Rfl:    Review of Systems:   Review of Systems  HENT:  Positive for ear pain (bilateral) and sinus pain (left-sided).   Neurological:        (+) Migraine (See HPI)    Vitals:   Vitals:   12/26/22 0853  Temp: (!) 97.1 F (36.2 C)  TempSrc: Temporal  Weight: 127 lb 12.8 oz (58 kg)  Height: 5\' 2"  (1.575 m)     Body mass index is 23.37 kg/m.  Physical Exam:   Physical Exam Vitals and nursing note reviewed.  Constitutional:      General: She is not in acute distress.    Appearance: She is well-developed. She is not ill-appearing or toxic-appearing.  HENT:  Head:     Comments: Left maxillary sinus tenderness Cardiovascular:     Rate and Rhythm: Normal rate and regular rhythm.     Pulses: Normal pulses.     Heart sounds: Normal heart sounds, S1 normal and S2 normal.  Pulmonary:     Effort: Pulmonary effort is normal.     Breath sounds: Normal breath sounds.  Skin:    General: Skin is warm and dry.  Neurological:     Mental Status: She is alert.     GCS: GCS eye subscore is 4. GCS verbal subscore is 5. GCS motor subscore is 6.     Cranial Nerves: Cranial nerves 2-12 are intact.     Sensory: Sensation is intact.     Motor: Motor function is intact.     Coordination: Coordination is intact.     Gait: Gait is intact.   Psychiatric:        Speech: Speech normal.        Behavior: Behavior normal. Behavior is cooperative.     Assessment and Plan:   Migraine with aura and without status migrainosus, not intractable No red flags on exam Has never been on medication -- Nurtec sample provided today Recommend send MyChart message if helpful and we can prescribe Follow-up in 1-3 months for Comprehensive Physical Exam (CPE) preventive care annual visit and further discussion  Sinus congestion No red flags on exam.  Will initiate augmentin per orders. Discussed taking medications as prescribed. Reviewed return precautions including worsening fever, SOB, worsening cough or other concerns. Push fluids and rest. I recommend that patient follow-up if symptoms worsen or persist despite treatment x 7-10 days, sooner if needed.  I,Emily Lagle,acting as a Neurosurgeon for [employer], PA.,have documented all relevant documentation on the behalf of Jarold Motto, PA,as directed by  Jarold Motto, PA while in the presence of Jarold Motto, [place].  I, Jarold Motto, [place], have reviewed all documentation for this visit. The documentation on 12/26/22 for the exam, diagnosis, procedures, and orders are all accurate and complete.  Jarold Motto, PA-C

## 2022-12-26 NOTE — Patient Instructions (Signed)
It was great to see you!  Start Augmentin Consider Flonase (OTC (available over the counter without a prescription)) in AM and PM  For your migraine, try the Nurtec -- if this helps, I can send in  Follow-up with me in 1-3 months for a follow-up on migraines and a Comprehensive Physical Exam (CPE) preventive care annual visit with blood work  Take care,  [employer] PA-C
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
## 2023-02-26 ENCOUNTER — Ambulatory Visit (INDEPENDENT_AMBULATORY_CARE_PROVIDER_SITE_OTHER): Payer: 59 | Admitting: Physician Assistant

## 2023-02-26 ENCOUNTER — Encounter: Payer: Self-pay | Admitting: Physician Assistant

## 2023-02-26 ENCOUNTER — Other Ambulatory Visit: Payer: Self-pay | Admitting: Physician Assistant

## 2023-02-26 VITALS — BP 100/70 | HR 71 | Temp 97.8°F | Ht 62.0 in | Wt 127.0 lb

## 2023-02-26 DIAGNOSIS — G43109 Migraine with aura, not intractable, without status migrainosus: Secondary | ICD-10-CM

## 2023-02-26 DIAGNOSIS — Z13220 Encounter for screening for lipoid disorders: Secondary | ICD-10-CM

## 2023-02-26 DIAGNOSIS — Z1159 Encounter for screening for other viral diseases: Secondary | ICD-10-CM | POA: Diagnosis not present

## 2023-02-26 DIAGNOSIS — Z136 Encounter for screening for cardiovascular disorders: Secondary | ICD-10-CM | POA: Diagnosis not present

## 2023-02-26 DIAGNOSIS — Z0000 Encounter for general adult medical examination without abnormal findings: Secondary | ICD-10-CM

## 2023-02-26 LAB — CBC WITH DIFFERENTIAL/PLATELET
Basophils Absolute: 0 10*3/uL (ref 0.0–0.1)
Basophils Relative: 0.8 % (ref 0.0–3.0)
Eosinophils Absolute: 0.2 10*3/uL (ref 0.0–0.7)
Eosinophils Relative: 3.9 % (ref 0.0–5.0)
HCT: 42.9 % (ref 36.0–46.0)
Hemoglobin: 13.8 g/dL (ref 12.0–15.0)
Lymphocytes Relative: 34 % (ref 12.0–46.0)
Lymphs Abs: 1.6 10*3/uL (ref 0.7–4.0)
MCHC: 32.2 g/dL (ref 30.0–36.0)
MCV: 84.1 fL (ref 78.0–100.0)
Monocytes Absolute: 0.2 10*3/uL (ref 0.1–1.0)
Monocytes Relative: 4.7 % (ref 3.0–12.0)
Neutro Abs: 2.7 10*3/uL (ref 1.4–7.7)
Neutrophils Relative %: 56.6 % (ref 43.0–77.0)
Platelets: 283 10*3/uL (ref 150.0–400.0)
RBC: 5.1 Mil/uL (ref 3.87–5.11)
RDW: 14 % (ref 11.5–15.5)
WBC: 4.8 10*3/uL (ref 4.0–10.5)

## 2023-02-26 LAB — COMPREHENSIVE METABOLIC PANEL
ALT: 18 U/L (ref 0–35)
AST: 20 U/L (ref 0–37)
Albumin: 4.9 g/dL (ref 3.5–5.2)
Alkaline Phosphatase: 46 U/L (ref 39–117)
BUN: 16 mg/dL (ref 6–23)
CO2: 27 meq/L (ref 19–32)
Calcium: 9.9 mg/dL (ref 8.4–10.5)
Chloride: 105 meq/L (ref 96–112)
Creatinine, Ser: 0.79 mg/dL (ref 0.40–1.20)
GFR: 97.65 mL/min (ref >60.00)
Glucose, Bld: 71 mg/dL (ref 70–99)
Potassium: 3.8 meq/L (ref 3.5–5.1)
Sodium: 140 meq/L (ref 135–145)
Total Bilirubin: 0.6 mg/dL (ref 0.2–1.2)
Total Protein: 7.1 g/dL (ref 6.0–8.3)

## 2023-02-26 LAB — LIPID PANEL
Cholesterol: 178 mg/dL (ref 0–200)
HDL: 85.5 mg/dL (ref >39.00)
LDL Cholesterol: 77 mg/dL (ref 0–99)
NonHDL: 92.47
Total CHOL/HDL Ratio: 2
Triglycerides: 76 mg/dL (ref 0.0–149.0)
VLDL: 15.2 mg/dL (ref 0.0–40.0)

## 2023-02-26 MED ORDER — RIZATRIPTAN BENZOATE 5 MG PO TABS: 5.0000 mg | ORAL_TABLET | ORAL | 0 refills | Status: DC | PRN

## 2023-02-26 MED ORDER — NURTEC 75 MG PO TBDP: 75.0000 mg | ORAL_TABLET | ORAL | 11 refills | Status: DC

## 2023-02-26 NOTE — Patient Instructions (Signed)
It was great to see you! ? ?Please go to the lab for blood work.  ? ?Our office will call you with your results unless you have chosen to receive results via MyChart. ? ?If your blood work is normal we will follow-up each year for physicals and as scheduled for chronic medical problems. ? ?If anything is abnormal we will treat accordingly and get you in for a follow-up. ? ?Take care, ? ?Jerrin Recore ?  ? ? ?

## 2023-02-27 LAB — HEPATITIS C ANTIBODY: Hepatitis C Ab: NONREACTIVE

## 2023-04-24 LAB — HM PAP SMEAR: HPV, high-risk: NEGATIVE

## 2023-06-12 ENCOUNTER — Telehealth: Payer: 59 | Admitting: Physician Assistant

## 2023-06-12 ENCOUNTER — Telehealth: Payer: 59 | Admitting: Family Medicine

## 2023-06-12 DIAGNOSIS — R519 Headache, unspecified: Secondary | ICD-10-CM

## 2023-06-12 DIAGNOSIS — J069 Acute upper respiratory infection, unspecified: Secondary | ICD-10-CM

## 2023-06-12 MED ORDER — PSEUDOEPH-BROMPHEN-DM 30-2-10 MG/5ML PO SYRP: 5.0000 mL | ORAL_SOLUTION | Freq: Four times a day (QID) | ORAL | 0 refills | Status: DC | PRN

## 2023-06-12 MED ORDER — AMOXICILLIN-POT CLAVULANATE 875-125 MG PO TABS: 1.0000 | ORAL_TABLET | Freq: Two times a day (BID) | ORAL | 0 refills | Status: AC

## 2023-06-12 NOTE — Progress Notes (Signed)
   Thank you for the details you included in the comment boxes. Those details are very helpful in determining the best course of treatment for you and help [place] to provide the best care.Because of need to further differentiate what is going on so we can properly treat you, we recommend that you schedule a Virtual Urgent Care video visit in order for the provider to better assess what is going on.  The provider will be able to give you a more accurate diagnosis and treatment plan if we can more freely discuss your symptoms and with the addition of a virtual examination.   If you change your visit to a video visit, we will bill your insurance (similar to an office visit) and you will not be charged for this e-Visit. You will be able to stay at home and speak with the first available [facility] advanced practice provider. The link to do a video visit is in the drop down Menu tab of your Welcome screen in MyChart.

## 2023-06-12 NOTE — Patient Instructions (Signed)
  Sally Oliver, thank you for joining [name], NP for today's virtual visit.  While this provider is not your primary care provider (PCP), if your PCP is located in our provider database this encounter information will be shared with them immediately following your visit.   A [facility] MyChart account gives you access to today's visit and all your visits, tests, and labs performed at [facility] " click here if you don't have a [facility] MyChart account or go to [email or web address]  Consent: (Patient) Sally Oliver provided verbal consent for this virtual visit at the beginning of the encounter.  Current Medications:  Current Outpatient Medications:    [START ON 06/15/2023] amoxicillin-clavulanate (AUGMENTIN) 875-125 MG tablet, Take 1 tablet by mouth 2 (two) times daily for 7 days., Disp: 14 tablet, Rfl: 0   brompheniramine-pseudoephedrine-DM 30-2-10 MG/5ML syrup, Take 5 mLs by mouth 4 (four) times daily as needed., Disp: 120 mL, Rfl: 0   cetirizine (ZYRTEC) 10 MG chewable tablet, Chew 10 mg by mouth daily., Disp: , Rfl:    Rimegepant Sulfate (NURTEC) 75 MG TBDP, Take 1 tablet (75 mg total) by mouth every other day., Disp: 16 tablet, Rfl: 11   rizatriptan (MAXALT) 5 MG tablet, Take 1 tablet (5 mg total) by mouth as needed for migraine. May repeat in 2 hours if needed, Disp: 10 tablet, Rfl: 0   Medications ordered in this encounter:  Meds ordered this encounter  Medications   brompheniramine-pseudoephedrine-DM 30-2-10 MG/5ML syrup    Sig: Take 5 mLs by mouth 4 (four) times daily as needed.    Dispense:  120 mL    Refill:  0    Supervising Provider:   Merrilee Jansky [[id number]]   amoxicillin-clavulanate (AUGMENTIN) 875-125 MG tablet    Sig: Take 1 tablet by mouth 2 (two) times daily for 7 days.    Dispense:  14 tablet    Refill:  0    Supervising Provider:   Merrilee Jansky [[id number]]     *If you need refills on other medications  prior to your next appointment, please contact your pharmacy*  Follow-Up: Call back or seek an in-person evaluation if the symptoms worsen or if the condition fails to improve as anticipated.  [facility] [phone]  Other Instructions  URI recommendations: - Increased rest - Increasing Fluids - Acetaminophen / ibuprofen as needed for fever/pain.  - Salt water gargling, chloraseptic spray and throat lozenges - Mucinex if mucus is present and increasing.  - Saline nasal spray if congestion or if nasal passages feel dry. - Humidifying the air.     If you have been instructed to have an in-person evaluation today at a local Urgent Care facility, please use the link below. It will take you to a list of all of our available [facility], including address, phone number and hours of operation. Please do not delay care.  [facility]  If you or a family member do not have a primary care provider, use the link below to schedule a visit and establish care. When you choose a [facility] primary care physician or advanced practice provider, you gain a long-term partner in health. Find a Primary Care Provider  Learn more about [facility] in-office and virtual care options: [facility] - Get Care Now

## 2023-06-12 NOTE — Progress Notes (Signed)
Virtual Visit Consent   Sally Oliver, you are scheduled for a virtual visit with a [facility] provider today. Just as with appointments in the office, your consent must be obtained to participate. Your consent will be active for this visit and any virtual visit you may have with one of our providers in the next 365 days. If you have a MyChart account, a copy of this consent can be sent to you electronically.  As this is a virtual visit, video technology does not allow for your provider to perform a traditional examination. This may limit your provider's ability to fully assess your condition. If your provider identifies any concerns that need to be evaluated in person or the need to arrange testing (such as labs, EKG, etc.), we will make arrangements to do so. Although advances in technology are sophisticated, we cannot ensure that it will always work on either your end or our end. If the connection with a video visit is poor, the visit may have to be switched to a telephone visit. With either a video or telephone visit, we are not always able to ensure that we have a secure connection.  By engaging in this virtual visit, you consent to the provision of healthcare and authorize for your insurance to be billed (if applicable) for the services provided during this visit. Depending on your insurance coverage, you may receive a charge related to this service.  I need to obtain your verbal consent now. Are you willing to proceed with your visit today? [name] has provided verbal consent on 06/12/2023 for a virtual visit (video or telephone). [name], NP  Date: 06/12/2023 8:45 AM  Virtual Visit via Video Note   I, [name], connected with  Sally Oliver  ([id number], 10/07/1990) on 06/12/23 at  8:45 AM EST by a video-enabled telemedicine application and verified that I am speaking with the correct person using two identifiers.  Location: Patient:  Virtual Visit Location Patient: Home Provider: Virtual Visit Location Provider: Home Office   I discussed the limitations of evaluation and management by telemedicine and the availability of in person appointments. The patient expressed understanding and agreed to proceed.    History of Present Illness: Sally Oliver is a 33 y.o. who identifies as a female who was assigned female at birth, and is being seen today for sinus infection  Onset was in the last week- sinus pressure, congestion, runny nose Associated symptoms are swollen glands in neck  Modifying factors are  tylenol cold and sinus  Denies chest pain, shortness of breath, fevers, chills  Exposure to sick contacts- unknown COVID test: no Vaccines: not UTD   Problems:  Patient Active Problem List   Diagnosis Date Noted   Maternal anemia, with delivery 06/04/2019   Fibroadenoma of left breast 01/21/2013    Allergies: No Known Allergies Medications:  Current Outpatient Medications:    cetirizine (ZYRTEC) 10 MG chewable tablet, Chew 10 mg by mouth daily., Disp: , Rfl:    Rimegepant Sulfate (NURTEC) 75 MG TBDP, Take 1 tablet (75 mg total) by mouth every other day., Disp: 16 tablet, Rfl: 11   rizatriptan (MAXALT) 5 MG tablet, Take 1 tablet (5 mg total) by mouth as needed for migraine. May repeat in 2 hours if needed, Disp: 10 tablet, Rfl: 0  Observations/Objective: Patient is well-developed, well-nourished in no acute distress.  Resting comfortably  at home.  Head is normocephalic, atraumatic.  No labored  breathing.  Speech is clear and coherent with logical content.  Patient is alert and oriented at baseline.  Congestion noted   Assessment and Plan:  1. Upper respiratory tract infection, unspecified type (Primary)  - brompheniramine-pseudoephedrine-DM 30-2-10 MG/5ML syrup; Take 5 mLs by mouth 4 (four) times daily as needed.  Dispense: 120 mL; Refill: 0 - amoxicillin-clavulanate (AUGMENTIN) 875-125 MG  tablet; Take 1 tablet by mouth 2 (two) times daily for 7 days.  Dispense: 14 tablet; Refill: 0   URI recommendations: Delayed Rx if symptoms are not improving  - Increased rest - Increasing Fluids - Acetaminophen / ibuprofen as needed for fever/pain.  - Salt water gargling, chloraseptic spray and throat lozenges - Mucinex if mucus is present and increasing.  - Saline nasal spray if congestion or if nasal passages feel dry. - Humidifying the air.   Reviewed side effects, risks and benefits of medication.    Patient acknowledged agreement and understanding of the plan.   Past Medical, Surgical, Social History, Allergies, and Medications have been Reviewed.      Follow Up Instructions: I discussed the assessment and treatment plan with the patient. The patient was provided an opportunity to ask questions and all were answered. The patient agreed with the plan and demonstrated an understanding of the instructions.  A copy of instructions were sent to the patient via MyChart unless otherwise noted below.    The patient was advised to call back or seek an in-person evaluation if the symptoms worsen or if the condition fails to improve as anticipated.    [name], NP

## 2023-11-05 ENCOUNTER — Encounter: Payer: Self-pay | Admitting: Physician Assistant

## 2023-11-05 ENCOUNTER — Other Ambulatory Visit: Payer: Self-pay | Admitting: *Deleted

## 2023-11-05 MED ORDER — RIZATRIPTAN BENZOATE 5 MG PO TABS: 5.0000 mg | ORAL_TABLET | ORAL | 2 refills | Status: DC | PRN

## 2024-02-13 ENCOUNTER — Encounter: Payer: Self-pay | Admitting: Physician Assistant

## 2024-02-13 ENCOUNTER — Ambulatory Visit (INDEPENDENT_AMBULATORY_CARE_PROVIDER_SITE_OTHER): Admitting: Physician Assistant

## 2024-02-13 VITALS — BP 120/84 | HR 77 | Temp 98.1°F | Ht 62.0 in | Wt 135.0 lb

## 2024-02-13 DIAGNOSIS — H6993 Unspecified Eustachian tube disorder, bilateral: Secondary | ICD-10-CM | POA: Diagnosis not present

## 2024-02-13 MED ORDER — IPRATROPIUM BROMIDE 0.03 % NA SOLN: 2.0000 | Freq: Two times a day (BID) | NASAL | 1 refills | Status: AC

## 2024-02-13 NOTE — Patient Instructions (Signed)
 It was great to see you!  Continue regular use of over the counter antihistamines such as Zyrtec (cetirizine), Claritin (loratadine), Allegra (fexofenadine), or Xyzal (levocetirizine) daily as needed. May take twice a day if needed as long as it does not cause drowsiness. Start Nasal saline spray (i.e., Simply Saline) or nasal saline lavage (i.e., NeilMed) is recommended as needed and prior to medicated nasal sprays. Start ipratroprium nasal spray 2 sprays twice daily  If no improvement trial sudafed decongestant for a few days If still no improvement, message me and we can trial oral prednisone   Message me if new fevers/chills/etc    Take care,  Lucie Buttner PA-C

## 2024-02-13 NOTE — Progress Notes (Signed)
 Sally Oliver is a 33 y.o. female here for a new problem.  History of Present Illness:   Chief Complaint  Patient presents with   Ear Pain    Right ear pain since 02/09/2024 and has gotten worse   Started having R ear pain a few days ago Lots of pressure in both sides of ears, getting worse Both kids have had ear infections recently She is taking allegra daily  Reports there is no fevers, chills, body aches, flu-like symptom(s)    Past Medical History:  Diagnosis Date   Chicken pox    Cholestasis during pregnancy    History of endometriosis    Migraine headache    Scoliosis      Social History   Tobacco Use   Smoking status: Never   Smokeless tobacco: Never  Substance Use Topics   Alcohol use: Yes    Comment: Rarely   Drug use: No    Past Surgical History:  Procedure Laterality Date   BREAST BIOPSY  05/20/2012   left   BREAST BIOPSY Right 04/02/2022   US  RT BREAST BX W LOC DEV 1ST LESION IMG BX SPEC US  GUIDE 04/02/2022 [facility]   BREAST BIOPSY Right 04/02/2022   US  RT BREAST BX W LOC DEV EA ADD LESION IMG BX SPEC US  GUIDE 04/02/2022 [facility]   BREAST EXCISIONAL BIOPSY Left 2011   IUD REMOVAL N/A 08/16/2021   Procedure: INTRAUTERINE DEVICE (IUD) REMOVAL;  Surgeon: Rendell Calton LABOR, DO;  Location: [facility];  Service: Gynecology;  Laterality: N/A;   LAPAROSCOPIC BILATERAL SALPINGECTOMY Bilateral 08/16/2021   Procedure: LAPAROSCOPIC BILATERAL TUBAL LIGATION BY SALPINGECTOMY;  Surgeon: Rendell Calton LABOR, DO;  Location: [facility];  Service: Gynecology;  Laterality: Bilateral;   TUBAL LIGATION  03/23   WISDOM TOOTH EXTRACTION      Family History  Problem Relation Age of Onset   Migraines Mother    Osteoarthritis Mother    Heart disease Father    Stroke Father 15       PFO   Arthritis Father    Alzheimer's disease Maternal Grandmother    Cancer Maternal Grandmother        ?colon vs small intestine  cancer   Stroke Maternal Grandfather    Diverticulitis Paternal Grandmother     No Known Allergies  Current Medications:   Current Outpatient Medications:    cetirizine (ZYRTEC) 10 MG chewable tablet, Chew 10 mg by mouth daily., Disp: , Rfl:    rizatriptan  (MAXALT ) 5 MG tablet, Take 1 tablet (5 mg total) by mouth as needed for migraine. May repeat in 2 hours if needed, Disp: 10 tablet, Rfl: 2   brompheniramine-pseudoephedrine-DM 30-2-10 MG/5ML syrup, Take 5 mLs by mouth 4 (four) times daily as needed. (Patient not taking: Reported on 02/13/2024), Disp: 120 mL, Rfl: 0   Rimegepant Sulfate (NURTEC) 75 MG TBDP, Take 1 tablet (75 mg total) by mouth every other day. (Patient not taking: Reported on 02/13/2024), Disp: 16 tablet, Rfl: 11   Review of Systems:   Negative unless otherwise specified per HPI.  Vitals:   Vitals:   02/13/24 1248  BP: 120/84  Pulse: 77  Temp: 98.1 F (36.7 C)  SpO2: 95%  Weight: 135 lb (61.2 kg)  Height: 5' 2 (1.575 m)     Body mass index is 24.69 kg/m.  Physical Exam:   Physical Exam Vitals and nursing note reviewed.  Constitutional:      General: She is  not in acute distress.    Appearance: She is well-developed. She is not ill-appearing or toxic-appearing.  HENT:     Head: Normocephalic and atraumatic.     Right Ear: Ear canal and external ear normal. A middle ear effusion is present. Tympanic membrane is not erythematous, retracted or bulging.     Left Ear: Ear canal and external ear normal. A middle ear effusion is present. Tympanic membrane is not erythematous, retracted or bulging.     Nose: Nose normal.     Right Sinus: No maxillary sinus tenderness or frontal sinus tenderness.     Left Sinus: No maxillary sinus tenderness or frontal sinus tenderness.     Mouth/Throat:     Pharynx: Uvula midline. No posterior oropharyngeal erythema.  Eyes:     General: Lids are normal.     Conjunctiva/sclera: Conjunctivae normal.  Neck:     Trachea:  Trachea normal.  Cardiovascular:     Rate and Rhythm: Normal rate and regular rhythm.     Heart sounds: Normal heart sounds, S1 normal and S2 normal.  Pulmonary:     Effort: Pulmonary effort is normal.     Breath sounds: Normal breath sounds. No decreased breath sounds, wheezing, rhonchi or rales.  Lymphadenopathy:     Cervical: No cervical adenopathy.  Skin:    General: Skin is warm and dry.  Neurological:     Mental Status: She is alert.  Psychiatric:        Speech: Speech normal.        Behavior: Behavior normal. Behavior is cooperative.     Assessment and Plan:   Dysfunction of both eustachian tubes No red flags No evidence of infection Advised as follows: Continue regular use of over the counter antihistamines such as Zyrtec (cetirizine), Claritin (loratadine), Allegra (fexofenadine), or Xyzal (levocetirizine) daily as needed. May take twice a day if needed as long as it does not cause drowsiness. Start Nasal saline spray (i.e., Simply Saline) or nasal saline lavage (i.e., NeilMed) is recommended as needed and prior to medicated nasal sprays. Start ipratroprium nasal spray 2 sprays twice daily  If no improvement trial sudafed decongestant for a few days If still no improvement, message me and we can trial oral prednisone   Message me if new fevers/chills/etc    Lucie Buttner, PA-C

## 2024-02-16 ENCOUNTER — Telehealth: Payer: Self-pay | Admitting: Physician Assistant

## 2024-02-16 NOTE — Telephone Encounter (Signed)
 Was seen at [facility] on 02/14/24  Patient Name First: Sally Last: Oliver Gender: Female DOB: 08-08-1990 Age: 33 Y 3 M 20 D Return Phone Number: [phone] (Primary), [phone] (Secondary) Address: [place]  [address] Client [facility] - Human resources officer [facility] [employer] Provider [facility] [place] Contact Type Call Who Is Calling Patient / Member / Family / Caregiver Call Type Triage / Clinical Relationship To Patient Self Return Phone Number [phone] (Primary) Chief Complaint Ear Fullness or Congestion Reason for Call Symptomatic / Request for Health Information Initial Comment Caller states his wife was seen in office on Friday, they wrote her a prescription. As of today they don't have it yet. She is currently experiencing fluid in her ears, now she feels that it moved into a sinus infection. The over the counter medication did not work. Translation No Nurse Assessment Nurse: Cordella, RN, Scarlet Date/Time Titus Time): 02/15/2024 12:58:56 PM Confirm and document reason for call. If symptomatic, describe symptoms. ---Caller states that on Friday was seen in clinic. Reports was called a nasal spray but pharmacy did not receive Rx. Has been using OTC medication with minimal relief. Presently caller states that symptoms have worsened. States that left side nostril is swollen shut. C/o pain to left inner ear and left upper jaw. PSR 5/10 described as throbbing discomfort. Does the patient have any new or worsening symptoms? ---Yes Will a triage be completed? ---Yes Related visit to physician within the last 2 weeks? ---No Does the PT have any chronic conditions? (i.e. diabetes, asthma, this includes High risk factors for pregnancy, etc.) ---No Is the patient pregnant or possibly pregnant? (Ask all females between the ages of 5-55) ---No Is this a behavioral  health or substance abuse call? ---No Guidelines Guideline Title Affirmed Question Affirmed Notes Nurse Date/Time Titus Time) Sinus Pain or Congestion Alwyn Cordella, RN, Scarlet 02/15/2024 1:04:12 PM Disp. Time Titus Time) Disposition Final User 02/15/2024 1:12:08 PM See PCP within 24 Hours Yes Cordella, RN, Scarlet Final Disposition 02/15/2024 1:12:08 PM See PCP within 24 Hours Yes Cordella, RN, Scarlet Caller Disagree/Comply Comply Caller Understands Yes PreDisposition Home Care Care Advice Given Per Guideline SEE PCP WITHIN 24 HOURS: * IF OFFICE WILL BE OPEN: You need to be examined within the next 24 hours. Call your doctor (or NP/PA) when the office opens and make an appointment. PAIN MEDICINES: * ACETAMINOPHEN  - REGULAR STRENGTH TYLENOL : Take 650 mg (two 325 mg pills) by mouth every 4 to 6 hours as needed. Each Regular Strength Tylenol  pill has 325 mg of acetaminophen . The most you should take is 10 pills a day (3,250 mg total). Note: In [place], the maximum is 12 pills a day (3,900 mg total). * IBUPROFEN  (SUCH AS MOTRIN , ADVIL ): Take 400 mg (two 200 mg pills) by mouth every 6 hours. The most you should take is 6 pills a day (1,200 mg total). PAIN MEDICINES - EXTRA NOTES AND WARNINGS: * Follow these dosing instructions unless your doctor (or NP/PA) has told you to take a different dose. COLD OR HEAT PACK FOR EAR PAIN: * Apply a cold pack or a cold wet washcloth to outer ear for 20 minutes to reduce pain while medicine takes effect. Some adults prefer local heat for 20 minutes. NASAL WASHES FOR A STUFFY NOSE: * Methods: There are several ways to irrigate the nose. You can use a saline nasal spray bottle (available over-the-counter), a rubber ear  syringe, a medical syringe without the needle, or a NETI POT. * If your nose feels blocked, you should try using nasal washes first. CALL BACK IF: * Difficulty breathing (and not relieved by cleaning out nose) * You become worse CARE ADVICE  given per Sinus Pain or Congestion (Adult) guideline. Referrals REFERRED TO PCP OFFICE

## 2024-02-16 NOTE — Telephone Encounter (Signed)
Pt was seen 9/26

## 2024-03-01 ENCOUNTER — Encounter: Payer: 59 | Admitting: Physician Assistant

## 2024-03-02 IMAGING — MG DIGITAL DIAGNOSTIC BILAT W/ TOMO W/ CAD
6 of 10 series · 6 of 30 positions shown · non-contrast
Comparison: None.

CLINICAL DATA: Palpable lump right breast

EXAM:
DIGITAL DIAGNOSTIC BILATERAL MAMMOGRAM WITH TOMOSYNTHESIS AND CAD;
ULTRASOUND RIGHT BREAST LIMITED
TECHNIQUE: Bilateral digital diagnostic mammography and breast tomosynthesis
was performed. The images were evaluated with computer-aided
detection.; Targeted ultrasound examination of the right breast was
performed

[R CC synth-2D (1 of 2)]
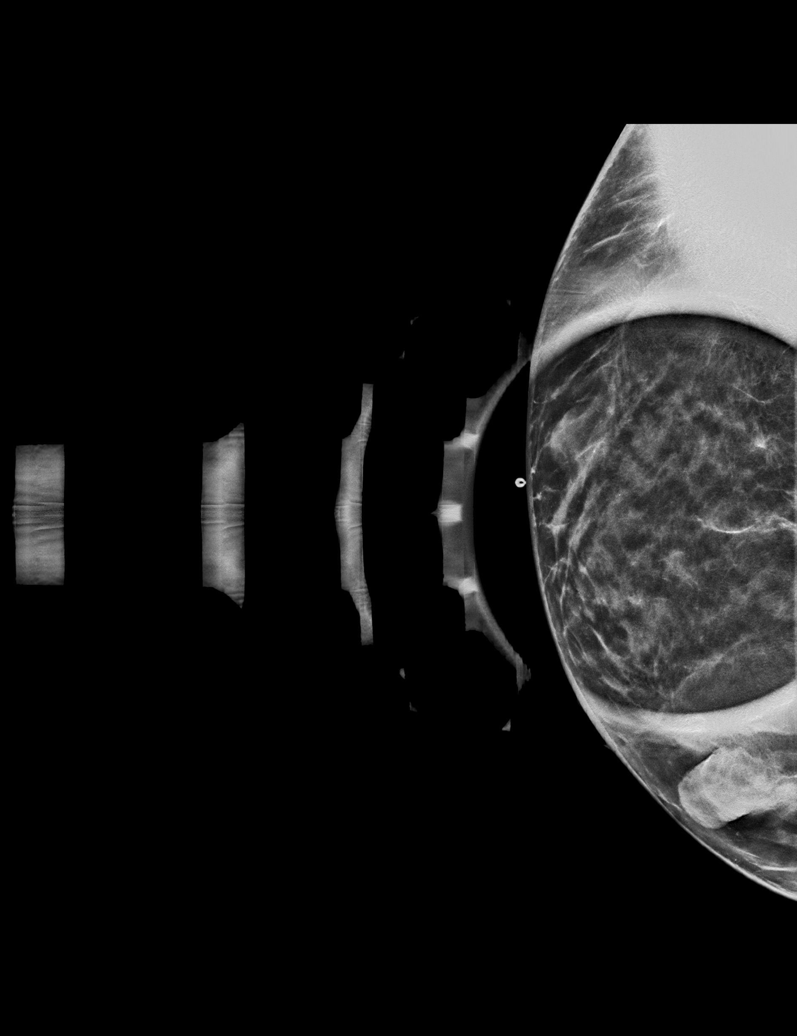

[L MLO synth-2D]
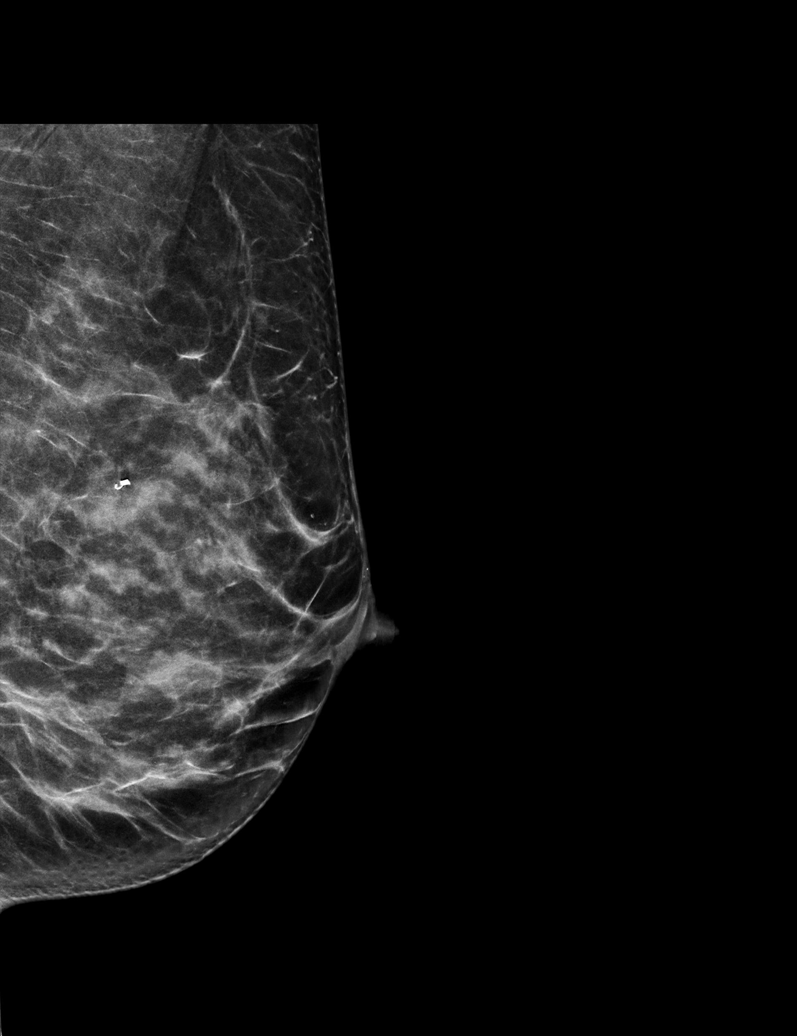

[R CC synth-2D (2 of 2)]
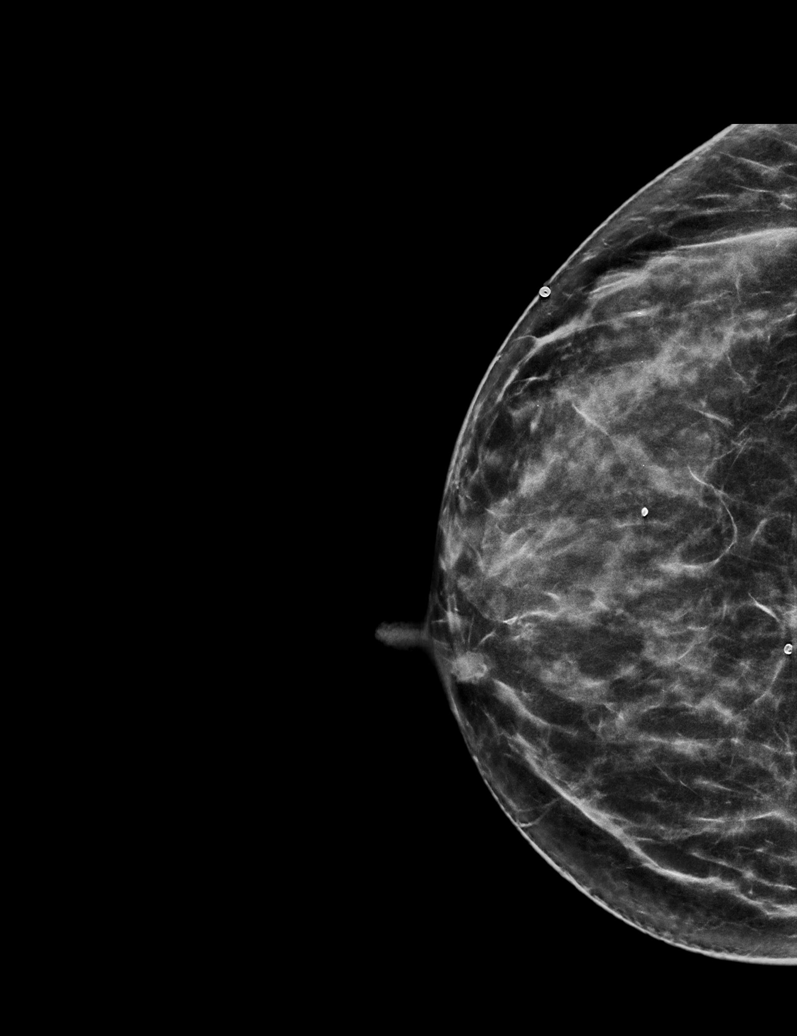

[R MLO synth-2D]
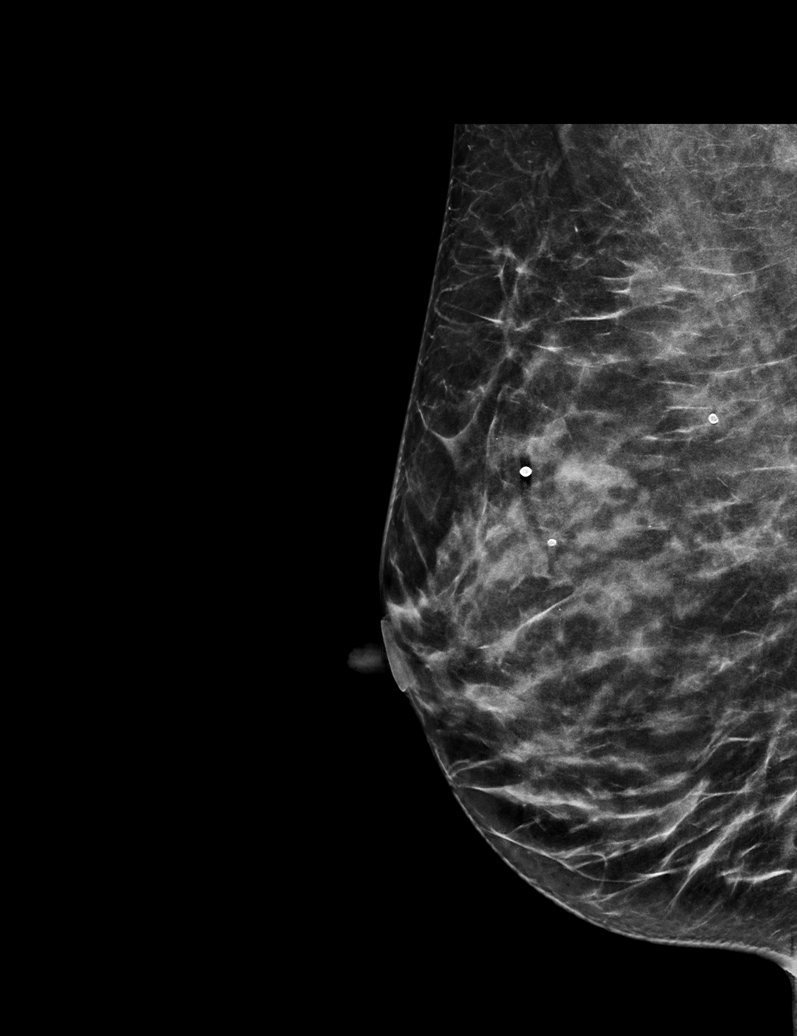

[L CC synth-2D]
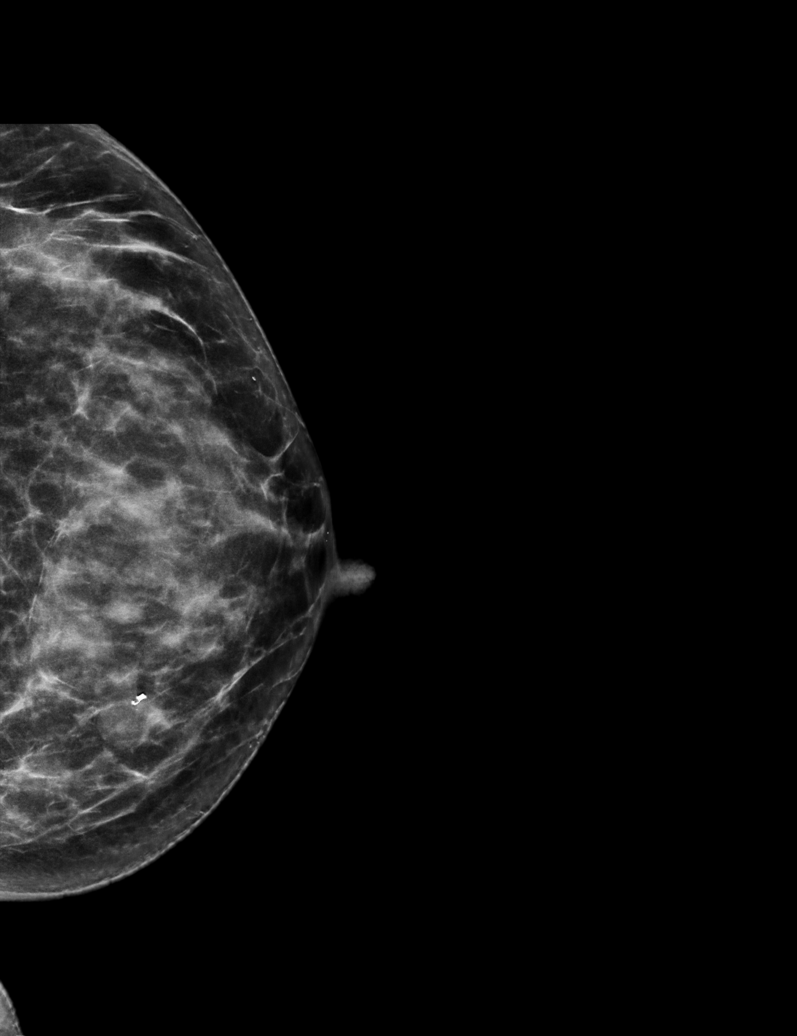

[R CC tomo · tomo slice 25/49.0]
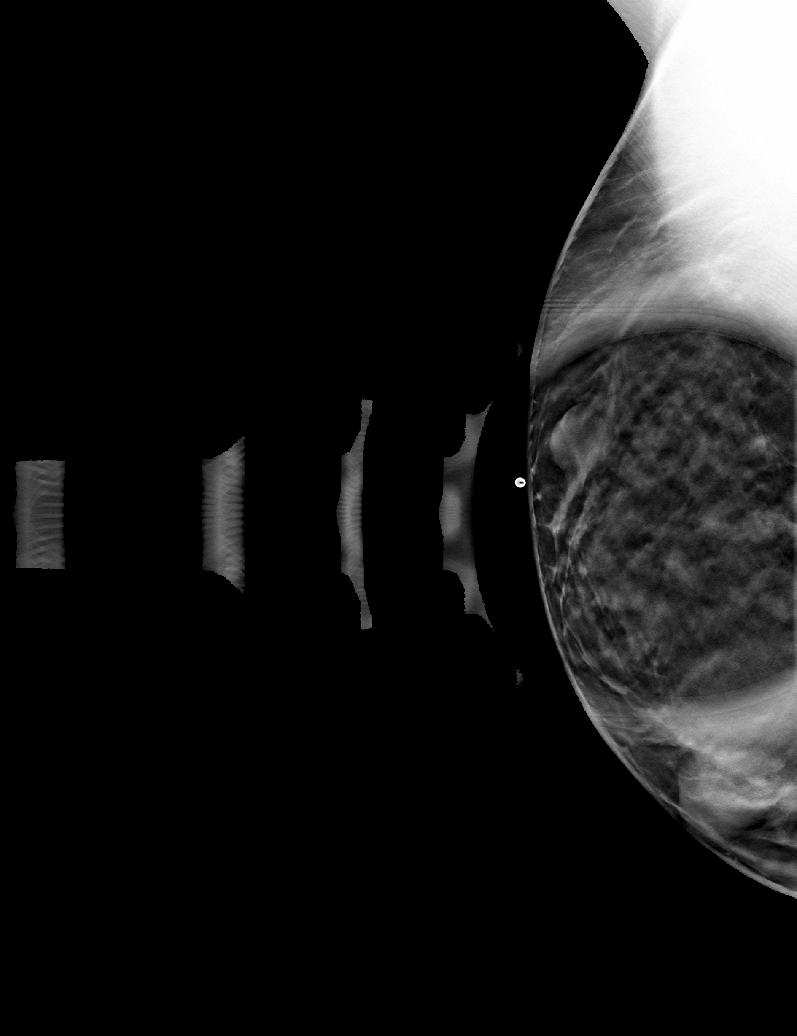

[6 of 30 positions shown; findings below may reference images not displayed]

ACR Breast Density Category d: The breast tissue is extremely dense,
which lowers the sensitivity of mammography.
FINDINGS: Cc and MLO views of bilateral breasts, spot tangential view of right
breast are submitted. At the palpable area upper-outer quadrant
right breast, there is a mass. In the subareolar right breast, there
is focal asymmetry. There is a prior biopsy mass with biopsy clip in
the left breast.

Targeted ultrasound is performed, showing 1.2 x 0.9 x 1.4 cm oval
hypoechoic mass at palpable area right breast 9 o'clock 5 cm from
nipple correlating to the mammographic mass. This is probably a
fibroadenoma. Ultrasound of the right breast subareolar region
demonstrate several dilated ducts which may correlate to the
mammographic asymmetry.
IMPRESSION: Probable benign findings.

RECOMMENDATION:
Six-month follow-up mammogram and ultrasound of right breast for
probable fibroadenoma right breast and subareolar asymmetry.

I have discussed the findings and recommendations with the patient.
If applicable, a reminder letter will be sent to the patient
regarding the next appointment.

BI-RADS CATEGORY  3: Probably benign.

## 2024-03-15 ENCOUNTER — Encounter: Payer: Self-pay | Admitting: Physician Assistant

## 2024-03-15 MED ORDER — RIZATRIPTAN BENZOATE 5 MG PO TABS: 5.0000 mg | ORAL_TABLET | ORAL | 2 refills | Status: AC | PRN

## 2024-04-02 ENCOUNTER — Encounter: Admitting: Physician Assistant

## 2024-04-07 ENCOUNTER — Encounter: Payer: Self-pay | Admitting: Physician Assistant

## 2024-04-07 ENCOUNTER — Ambulatory Visit (INDEPENDENT_AMBULATORY_CARE_PROVIDER_SITE_OTHER): Admitting: Physician Assistant

## 2024-04-07 VITALS — BP 106/70 | HR 76 | Temp 97.7°F | Ht 62.0 in | Wt 131.5 lb

## 2024-04-07 DIAGNOSIS — G43109 Migraine with aura, not intractable, without status migrainosus: Secondary | ICD-10-CM

## 2024-04-07 DIAGNOSIS — R454 Irritability and anger: Secondary | ICD-10-CM

## 2024-04-07 DIAGNOSIS — Z136 Encounter for screening for cardiovascular disorders: Secondary | ICD-10-CM | POA: Diagnosis not present

## 2024-04-07 DIAGNOSIS — Z13220 Encounter for screening for lipoid disorders: Secondary | ICD-10-CM

## 2024-04-07 DIAGNOSIS — Z0000 Encounter for general adult medical examination without abnormal findings: Secondary | ICD-10-CM | POA: Diagnosis not present

## 2024-04-07 LAB — CBC WITH DIFFERENTIAL/PLATELET
Basophils Absolute: 0.1 K/uL (ref 0.0–0.1)
Basophils Relative: 0.7 % (ref 0.0–3.0)
Eosinophils Absolute: 0.6 K/uL (ref 0.0–0.7)
Eosinophils Relative: 7.5 % — ABNORMAL HIGH (ref 0.0–5.0)
HCT: 40.1 % (ref 36.0–46.0)
Hemoglobin: 13.3 g/dL (ref 12.0–15.0)
Lymphocytes Relative: 25.8 % (ref 12.0–46.0)
Lymphs Abs: 1.9 K/uL (ref 0.7–4.0)
MCHC: 33.1 g/dL (ref 30.0–36.0)
MCV: 83.7 fl (ref 78.0–100.0)
Monocytes Absolute: 0.4 K/uL (ref 0.1–1.0)
Monocytes Relative: 5 % (ref 3.0–12.0)
Neutro Abs: 4.5 K/uL (ref 1.4–7.7)
Neutrophils Relative %: 61 % (ref 43.0–77.0)
Platelets: 287 K/uL (ref 150.0–400.0)
RBC: 4.8 Mil/uL (ref 3.87–5.11)
RDW: 13.9 % (ref 11.5–15.5)
WBC: 7.4 K/uL (ref 4.0–10.5)

## 2024-04-07 LAB — COMPREHENSIVE METABOLIC PANEL WITH GFR
ALT: 13 U/L (ref 0–35)
AST: 18 U/L (ref 0–37)
Albumin: 4.7 g/dL (ref 3.5–5.2)
Alkaline Phosphatase: 42 U/L (ref 39–117)
BUN: 13 mg/dL (ref 6–23)
CO2: 30 meq/L (ref 19–32)
Calcium: 10 mg/dL (ref 8.4–10.5)
Chloride: 104 meq/L (ref 96–112)
Creatinine, Ser: 0.84 mg/dL (ref 0.40–1.20)
GFR: 90.01 mL/min (ref >60.00)
Glucose, Bld: 72 mg/dL (ref 70–99)
Potassium: 4.3 meq/L (ref 3.5–5.1)
Sodium: 140 meq/L (ref 135–145)
Total Bilirubin: 0.5 mg/dL (ref 0.2–1.2)
Total Protein: 7 g/dL (ref 6.0–8.3)

## 2024-04-07 LAB — LIPID PANEL
Cholesterol: 190 mg/dL (ref 0–200)
HDL: 71 mg/dL (ref >39.00)
LDL Cholesterol: 95 mg/dL (ref 0–99)
NonHDL: 118.53
Total CHOL/HDL Ratio: 3
Triglycerides: 117 mg/dL (ref 0.0–149.0)
VLDL: 23.4 mg/dL (ref 0.0–40.0)

## 2024-04-07 NOTE — Progress Notes (Signed)
 Subjective:    Sally Oliver is a 33 y.o. female and is here for a comprehensive physical exam.  HPI  There are no preventive care reminders to display for this patient.  Discussed the use of AI scribe software for clinical note transcription with the patient, who gave verbal consent to proceed.  History of Present Illness   Sally Oliver is a 33 year old female who presents for a follow-up regarding her migraine management and mood concerns.  She experiences increased migraine frequency over the past few weeks, exacerbated by time and weather changes. Maxalt  is effective but causes lingering dizziness the following day. She has used it five times recently and is cautious with her supply. Nurtec samples were effective but are cost-prohibitive.  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 Her work schedule is from 8:30 AM to 1:30 PM on Tuesdays and until 12:30 PM on other weekdays. She feels overwhelmed balancing work and family, affecting her ability to exercise regularly. Despite this, she ensures adequate sleep and prioritizes rest.       Health Maintenance: Immunizations -- UpToDate  Colonoscopy -- n/a Mammogram -- n/a PAP -- UpToDate  Bone Density -- UpToDate  Diet -- overall healthy Exercise -- busy job  Sleep habits -- no concerns Mood -- no major concerns  UTD with dentist? - yes UTD with eye doctor? - no  Weight history: Wt Readings from Last 10 Encounters:  04/07/24 131 lb 8 oz (59.6 kg)  02/13/24 135 lb (61.2 kg)  02/26/23 127 lb (57.6 kg)  12/26/22 127 lb 12.8 oz (58 kg)  08/16/21 125 lb 8 oz (56.9 kg)  08/14/21 130 lb (59 kg)  09/19/20 124 lb 9 oz (56.5 kg)  09/04/20 124 lb 9.6 oz (56.5 kg)  06/04/19 151 lb 1.6 oz (68.5 kg)  12/17/17 107 lb 12.8 oz (48.9 kg)   Body mass index is 24.05 kg/m. Patient's last menstrual period was 04/07/2024  (exact date).  Alcohol use:  reports current alcohol use.  Tobacco use:  Tobacco Use: Low Risk  (04/07/2024)   Patient History    Smoking Tobacco Use: Never    Smokeless Tobacco Use: Never    Passive Exposure: Not on file   Eligible for lung cancer screening? no     04/07/2024    1:14 PM  Depression screen PHQ 2/9  Decreased Interest 0  Down, Depressed, Hopeless 0  PHQ - 2 Score 0     Other providers/specialists: Patient Care Team: Job Lukes, [place] as PCP - General (Physician Assistant)    PMHx, SurgHx, SocialHx, Medications, and Allergies were reviewed in the Visit Navigator and updated as appropriate.   Past Medical History:  Diagnosis Date   Chicken pox    Cholestasis during pregnancy    History of endometriosis    Migraine headache    Scoliosis      Past Surgical History:  Procedure Laterality Date   BREAST BIOPSY  05/20/2012   left   BREAST BIOPSY Right 04/02/2022   US  RT BREAST BX W LOC DEV 1ST LESION IMG BX SPEC US  GUIDE 04/02/2022 [facility]   BREAST BIOPSY Right 04/02/2022   US  RT BREAST BX W LOC DEV EA ADD LESION IMG BX SPEC US  GUIDE 04/02/2022 [facility]   BREAST EXCISIONAL BIOPSY Left 2011   IUD REMOVAL N/A 08/16/2021   Procedure: INTRAUTERINE DEVICE (IUD) REMOVAL;  Surgeon: Rendell Calton LABOR, DO;  Location: [facility];  Service: Gynecology;  Laterality: N/A;   LAPAROSCOPIC BILATERAL SALPINGECTOMY Bilateral 08/16/2021   Procedure: LAPAROSCOPIC BILATERAL TUBAL LIGATION BY SALPINGECTOMY;  Surgeon: Rendell Calton LABOR, DO;  Location:  SURGERY CENTER;  Service: Gynecology;  Laterality: Bilateral;   TUBAL LIGATION  03/23   WISDOM TOOTH EXTRACTION       Family History  Problem Relation Age of Onset   Migraines Mother    Osteoarthritis Mother    Heart disease Father    Stroke Father 64       PFO   Arthritis Father    Alzheimer's disease Maternal Grandmother    Cancer Maternal Grandmother        ?colon  vs small intestine cancer   Stroke Maternal Grandfather    Diverticulitis Paternal Grandmother     Social History   Tobacco Use   Smoking status: Never   Smokeless tobacco: Never  Substance Use Topics   Alcohol use: Yes    Comment: Rarely   Drug use: No    Review of Systems:   Review of Systems  Constitutional:  Negative for chills, fever, malaise/fatigue and weight loss.  HENT:  Negative for hearing loss, sinus pain and sore throat.   Respiratory:  Negative for cough and hemoptysis.   Cardiovascular:  Negative for chest pain, palpitations, leg swelling and PND.  Gastrointestinal:  Negative for abdominal pain, constipation, diarrhea, heartburn, nausea and vomiting.  Genitourinary:  Negative for dysuria, frequency and urgency.  Musculoskeletal:  Negative for back pain, myalgias and neck pain.  Skin:  Negative for itching and rash.  Neurological:  Negative for dizziness, tingling, seizures and headaches.  Endo/Heme/Allergies:  Negative for polydipsia.  Psychiatric/Behavioral:  Negative for depression. The patient is not nervous/anxious.     Objective:   BP 106/70 (BP Location: Left Arm, Patient Position: Sitting, Cuff Size: Normal)   Pulse 76   Temp 97.7 F (36.5 C) (Temporal)   Ht 5' 2 (1.575 m)   Wt 131 lb 8 oz (59.6 kg)   LMP 04/07/2024 (Exact Date)   SpO2 99%   BMI 24.05 kg/m  Body mass index is 24.05 kg/m.   General Appearance:    Alert, cooperative, no distress, appears stated age  Head:    Normocephalic, without obvious abnormality, atraumatic  Eyes:    PERRL, conjunctiva/corneas clear, EOM's intact, fundi    benign, both eyes  Ears:    Normal TM's and external ear canals, both ears  Nose:   Nares normal, septum midline, mucosa normal, no drainage    or sinus tenderness  Throat:   Lips, mucosa, and tongue normal; teeth and gums normal  Neck:   Supple, symmetrical, trachea midline, no adenopathy;    thyroid :  no enlargement/tenderness/nodules; no carotid    bruit or JVD  Back:     Symmetric, no curvature, ROM normal, no CVA tenderness  Lungs:     Clear to auscultation bilaterally, respirations unlabored  Chest Wall:    No tenderness or deformity   Heart:    Regular rate and rhythm, S1 and S2 normal, no murmur, rub or gallop  Breast Exam:    Deferred  Abdomen:     Soft, non-tender, bowel sounds active all four quadrants,    no masses, no organomegaly  Genitalia:    Deferred   Extremities:   Extremities normal, atraumatic, no cyanosis or edema  Pulses:   2+ and symmetric all extremities  Skin:  Skin color, texture, turgor normal, no rashes or lesions  Lymph nodes:   Cervical, supraclavicular, and axillary nodes normal  Neurologic:   CNII-XII intact, normal strength, sensation and reflexes    throughout    Assessment/Plan:   Assessment and Plan    Adult Wellness Visit Routine visit with no significant family history changes. Weight decreased since September. No sleep or mood concerns, some irritability noted. No recent flu shot. Blood work from last October was normal. - Ordered blood work. - Ensure Pap smear is up to date. - Encouraged regular dental and eye check-ups.  Migraine Managed with Maxalt , effective but causes next-day dizziness. Increased use due to time and weather changes. Nurtec effective but costly. Insurance may cover if Maxalt  is ineffective or causes side effects. - Continue Maxalt  for migraine management. - Consider Nurtec if Maxalt  becomes ineffective or causes intolerable side effects.  Irritability Increased irritability and anger, possibly work stress-related. Considering job reduction but committed to current role. No immediate medication plan unless symptoms worsen. - Monitor mood symptoms. - Consider medication if mood disturbance becomes unmanageable.  - reports there is no suicidal ideation/hi       Lucie Buttner, PA-C [facility]

## 2024-04-08 ENCOUNTER — Ambulatory Visit: Payer: Self-pay | Admitting: Physician Assistant

## 2025-04-08 ENCOUNTER — Encounter: Admitting: Physician Assistant
